# Patient Record
Sex: Female | Born: 1935 | Race: White | Hispanic: No | Smoking: Never smoker
Health system: Southern US, Community
[De-identification: ages and names within clinical notes are randomized; demographics above are authoritative.]

## PROBLEM LIST (undated history)

## (undated) DIAGNOSIS — E876 Hypokalemia: Secondary | ICD-10-CM

## (undated) DIAGNOSIS — Z8679 Personal history of other diseases of the circulatory system: Secondary | ICD-10-CM

## (undated) DIAGNOSIS — J45909 Unspecified asthma, uncomplicated: Secondary | ICD-10-CM

## (undated) DIAGNOSIS — R5381 Other malaise: Secondary | ICD-10-CM

## (undated) DIAGNOSIS — E871 Hypo-osmolality and hyponatremia: Secondary | ICD-10-CM

## (undated) DIAGNOSIS — K56609 Unspecified intestinal obstruction, unspecified as to partial versus complete obstruction: Secondary | ICD-10-CM

## (undated) DIAGNOSIS — K5790 Diverticulosis of intestine, part unspecified, without perforation or abscess without bleeding: Secondary | ICD-10-CM

## (undated) DIAGNOSIS — H9190 Unspecified hearing loss, unspecified ear: Secondary | ICD-10-CM

## (undated) DIAGNOSIS — M8580 Other specified disorders of bone density and structure, unspecified site: Secondary | ICD-10-CM

## (undated) DIAGNOSIS — E872 Acidosis, unspecified: Secondary | ICD-10-CM

## (undated) DIAGNOSIS — J189 Pneumonia, unspecified organism: Secondary | ICD-10-CM

## (undated) DIAGNOSIS — D62 Acute posthemorrhagic anemia: Secondary | ICD-10-CM

## (undated) DIAGNOSIS — D72829 Elevated white blood cell count, unspecified: Secondary | ICD-10-CM

## (undated) DIAGNOSIS — I509 Heart failure, unspecified: Secondary | ICD-10-CM

## (undated) DIAGNOSIS — J309 Allergic rhinitis, unspecified: Secondary | ICD-10-CM

## (undated) DIAGNOSIS — R002 Palpitations: Secondary | ICD-10-CM

## (undated) DIAGNOSIS — R55 Syncope and collapse: Secondary | ICD-10-CM

## (undated) DIAGNOSIS — N3020 Other chronic cystitis without hematuria: Secondary | ICD-10-CM

## (undated) DIAGNOSIS — E785 Hyperlipidemia, unspecified: Secondary | ICD-10-CM

## (undated) DIAGNOSIS — K219 Gastro-esophageal reflux disease without esophagitis: Secondary | ICD-10-CM

## (undated) DIAGNOSIS — H9193 Unspecified hearing loss, bilateral: Secondary | ICD-10-CM

## (undated) DIAGNOSIS — I2510 Atherosclerotic heart disease of native coronary artery without angina pectoris: Secondary | ICD-10-CM

## (undated) DIAGNOSIS — G43C0 Periodic headache syndromes in child or adult, not intractable: Secondary | ICD-10-CM

## (undated) DIAGNOSIS — M1990 Unspecified osteoarthritis, unspecified site: Secondary | ICD-10-CM

## (undated) DIAGNOSIS — I10 Essential (primary) hypertension: Secondary | ICD-10-CM

## (undated) HISTORY — DX: Palpitations: R00.2

## (undated) HISTORY — DX: Diverticulosis of intestine, part unspecified, without perforation or abscess without bleeding: K57.90

## (undated) HISTORY — DX: Hyperlipidemia, unspecified: E78.5

## (undated) HISTORY — DX: Hypokalemia: E87.6

## (undated) HISTORY — DX: Unspecified intestinal obstruction, unspecified as to partial versus complete obstruction: K56.609

## (undated) HISTORY — DX: Heart failure, unspecified: I50.9

## (undated) HISTORY — DX: Periodic headache syndromes in child or adult, not intractable: G43.C0

## (undated) HISTORY — DX: Hypomagnesemia: E83.42

## (undated) HISTORY — DX: Unspecified asthma, uncomplicated: J45.909

## (undated) HISTORY — DX: Hypo-osmolality and hyponatremia: E87.1

## (undated) HISTORY — DX: Allergic rhinitis, unspecified: J30.9

## (undated) HISTORY — DX: Acidosis: E87.2

## (undated) HISTORY — DX: Gastro-esophageal reflux disease without esophagitis: K21.9

## (undated) HISTORY — DX: Elevated white blood cell count, unspecified: D72.829

## (undated) HISTORY — DX: Acidosis, unspecified: E87.20

## (undated) HISTORY — DX: Other chronic cystitis without hematuria: N30.20

## (undated) HISTORY — DX: Atherosclerotic heart disease of native coronary artery without angina pectoris: I25.10

## (undated) HISTORY — DX: Acute posthemorrhagic anemia: D62

## (undated) HISTORY — DX: Other specified disorders of bone density and structure, unspecified site: M85.80

## (undated) HISTORY — DX: Unspecified hearing loss, bilateral: H91.93

## (undated) HISTORY — DX: Syncope and collapse: R55

## (undated) HISTORY — DX: Personal history of other diseases of the circulatory system: Z86.79

## (undated) HISTORY — DX: Pneumonia, unspecified organism: J18.9

## (undated) HISTORY — DX: Unspecified osteoarthritis, unspecified site: M19.90

## (undated) HISTORY — DX: Other malaise: R53.81

## (undated) HISTORY — DX: Essential (primary) hypertension: I10

## (undated) HISTORY — PX: EXPLORATORY LAPAROTOMY: SUR591

## (undated) HISTORY — DX: Unspecified hearing loss, unspecified ear: H91.90

---

## 2015-11-05 LAB — HEPATIC FUNCTION PANEL
ALK PHOS: 82 U/L (ref 25–125)
ALT: 14 U/L (ref 7–35)
AST: 26 U/L (ref 13–35)
BILIRUBIN, TOTAL: 0.5 mg/dL

## 2015-11-05 LAB — POCT INR: INR: 1 (ref 0.9–1.1)

## 2015-11-13 LAB — CBC AND DIFFERENTIAL
HCT: 33 % — AB (ref 36–46)
Hemoglobin: 11.3 g/dL — AB (ref 12.0–16.0)
PLATELETS: 205 10*3/uL (ref 150–399)
WBC: 13.3 10^3/mL

## 2015-11-15 LAB — BASIC METABOLIC PANEL
BUN: 11 mg/dL (ref 4–21)
CREATININE: 0.6 mg/dL (ref 0.5–1.1)
Glucose: 104 mg/dL
POTASSIUM: 3.7 mmol/L (ref 3.4–5.3)
SODIUM: 136 mmol/L — AB (ref 137–147)

## 2015-11-16 ENCOUNTER — Non-Acute Institutional Stay (SKILLED_NURSING_FACILITY): Payer: Medicare Other | Admitting: Adult Health

## 2015-11-16 ENCOUNTER — Encounter: Payer: Self-pay | Admitting: Adult Health

## 2015-11-16 DIAGNOSIS — J309 Allergic rhinitis, unspecified: Secondary | ICD-10-CM | POA: Diagnosis not present

## 2015-11-16 DIAGNOSIS — D62 Acute posthemorrhagic anemia: Secondary | ICD-10-CM | POA: Insufficient documentation

## 2015-11-16 DIAGNOSIS — K5669 Other intestinal obstruction: Secondary | ICD-10-CM

## 2015-11-16 DIAGNOSIS — K56609 Unspecified intestinal obstruction, unspecified as to partial versus complete obstruction: Secondary | ICD-10-CM | POA: Insufficient documentation

## 2015-11-16 DIAGNOSIS — E785 Hyperlipidemia, unspecified: Secondary | ICD-10-CM | POA: Insufficient documentation

## 2015-11-16 DIAGNOSIS — D72829 Elevated white blood cell count, unspecified: Secondary | ICD-10-CM | POA: Diagnosis not present

## 2015-11-16 DIAGNOSIS — I10 Essential (primary) hypertension: Secondary | ICD-10-CM | POA: Diagnosis not present

## 2015-11-16 DIAGNOSIS — R5381 Other malaise: Secondary | ICD-10-CM

## 2015-11-16 NOTE — Progress Notes (Signed)
Patient ID: Wendy Dickerson, female   DOB: 13-Sep-1924, 82 y.o.   MRN: [id number]    DATE:  11/16/2015   MRN:  [id number]  BIRTHDAY: Apr 07, 1925  Facility:  Nursing Home Location:  [facility]  Nursing Home Room Number: 601-P  LEVEL OF CARE:  SNF (31)  Contact Information    Name Relation Home Work Mobile   No name specified           Code Status History    This patient does not have a recorded code status. Please follow your organizational policy for patients in this situation.    Advance Directive Documentation        Most Recent Value   Type of Advance Directive  Out of facility DNR (pink MOST or yellow form)   Pre-existing out of facility DNR order (yellow form or pink MOST form)     "MOST" Form in Place?         Chief Complaint  Patient presents with  . Hospitalization Follow-up    HISTORY OF PRESENT ILLNESS:  This is an 82 year old female who has been admitted to [facility] on 11/15/15 from [facility]. She has PMH of hypertension, dyslipidemia, multiple environmental and medication allergies, previous history of abdominal surgeries and adhesions lysis in the past. She was diagnosed with small bowel obstruction and failed conservative management. She had exploratory laparotomy and lysis of adhesions on 11/07/15. They reduced her internal hernia, as well. She was treated for pneumonia with Levaquin 7 days.  Patient verbalized that she is allergic to and dairy products.  She has been admitted for a short-term rehabilitation.   PAST MEDICAL HISTORY:  Past Medical History  Diagnosis Date  . SBO (small bowel obstruction) (HCC)   . Pneumonia     Bilateral lower lobes  . Benign essential HTN   . Hyponatremia   . Vasovagal syncope   . Lactic acidosis   . Asthma   . Hard of hearing   . Osteopenia   . CHF (congestive heart failure) (HCC)   . Osteoarthritis   . GERD (gastroesophageal reflux disease)   . Hypokalemia   . Palpitations      PACs  . Hearing loss of both ears     Sensorineural   . History of rheumatic fever   . Hypomagnesemia   . Periodic headache syndrome   . HLD (hyperlipidemia)   . CAD (coronary artery disease)   . Chronic cystitis   . Diverticulosis      CURRENT MEDICATIONS: Reviewed  Patient's Medications  New Prescriptions   No medications on file  Previous Medications   ACETAMINOPHEN (TYLENOL) 325 MG TABLET    Take 650 mg by mouth every 4 (four) hours as needed.   ALBUTEROL (PROVENTIL HFA;VENTOLIN HFA) 108 (90 BASE) MCG/ACT INHALER    Inhale 1 puff into the lungs every 6 (six) hours as needed for wheezing or shortness of breath.   AMLODIPINE (NORVASC) 5 MG TABLET    Take 5 mg by mouth daily.   ASPIRIN 325 MG TABLET    Take 325 mg by mouth daily.   CALCIUM CARB-CHOLECALCIFEROL (CALTRATE 600+D) 600-800 MG-UNIT TABS    Take 2 tablets by mouth 2 (two) times daily.   CETIRIZINE (ZYRTEC) 10 MG TABLET    Take 10 mg by mouth daily.   FLUTICASONE (FLOVENT HFA) 220 MCG/ACT INHALER    Inhale 1 puff into the lungs 2 (two) times daily.   GLUCOSAMINE-CHONDROITIN 500-400 MG TABLET  Take 1 tablet by mouth 2 (two) times daily.   MAGNESIUM OXIDE (MAG-OX) 400 MG TABLET    Take 400 mg by mouth daily.   METOPROLOL SUCCINATE (TOPROL-XL) 50 MG 24 HR TABLET    Take 50 mg by mouth daily. Take with or immediately following a meal.   OMEGA-3 FATTY ACIDS (FISH OIL OMEGA-3 PO)    Take 1 g by mouth daily.   SIMVASTATIN (ZOCOR) 20 MG TABLET    Take 20 mg by mouth daily.   TRIAMTERENE-HYDROCHLOROTHIAZIDE (DYAZIDE) 37.5-25 MG CAPSULE    Take 1 capsule by mouth daily. Take at 6AM  Modified Medications   No medications on file  Discontinued Medications   No medications on file     Allergies  Allergen Reactions  . Ace Inhibitors   . Atrovent [Ipratropium]   . Augmentin [Amoxicillin-Pot Clavulanate]   . Beconase [Beclomethasone]   . Cartia Xt [Diltiazem Hcl]   . Celebrex [Celecoxib]   . Clarithromycin   . Corgard  [Nadolol]   . Doxycycline   . Intal [Cromolyn]   . Iodine   . Metoprolol   . Oxycontin [Oxycodone Hcl]   . Procardia [Nifedipine]   . Theophyllines      REVIEW OF SYSTEMS:  GENERAL: no change in appetite, no fatigue, no weight changes, no fever, chills or weakness EYES: Denies change in vision, dry eyes, eye pain, itching or discharge EARS: Denies change in hearing, ringing in ears, or earache NOSE: Denies nasal congestion or epistaxis MOUTH and THROAT: Denies oral discomfort, gingival pain or bleeding, pain from teeth or hoarseness   RESPIRATORY: no cough, SOB, DOE, wheezing, hemoptysis CARDIAC: no chest pain, or palpitations GI: no abdominal pain, diarrhea, constipation, heart burn, nausea or vomiting GU: Denies dysuria, frequency, hematuria, incontinence, or discharge PSYCHIATRIC: Denies feeling of depression or anxiety. No report of hallucinations, insomnia, paranoia, or agitation    PHYSICAL EXAMINATION  GENERAL APPEARANCE: Well nourished. In no acute distress. Normal body habitus SKIN:  Midline abdominal surgical incision has staples and covered with dry dressing, no erythema, has abdominal binder HEAD: Normal in size and contour. No evidence of trauma EYES: Lids open and close normally. No blepharitis, entropion or ectropion. PERRL. Conjunctivae are clear and sclerae are white. Lenses are without opacity EARS: Pinnae are normal. Patient hears normal voice tunes of the examiner MOUTH and THROAT: Lips are without lesions. Oral mucosa is moist and without lesions. Tongue is normal in shape, size, and color and without lesions NECK: supple, trachea midline, no neck masses, no thyroid tenderness, no thyromegaly LYMPHATICS: no LAN in the neck, no supraclavicular LAN RESPIRATORY: breathing is even & unlabored, BS CTAB CARDIAC: RRR, no murmur,no extra heart sounds, BLE edema 2+ GI: abdomen soft, normal BS, no masses, no tenderness, no hepatomegaly, no splenomegaly EXTREMITIES:   Able to move 4 extremities PSYCHIATRIC: Alert and oriented X 3. Affect and behavior are appropriate  LABS/RADIOLOGY: Labs reviewed: Basic Metabolic Panel:  Recent Labs  29/56/2106/26/17 0700  NA 136*  K 3.7  BUN 11  CREATININE 0.6   Liver Function Tests:  Recent Labs  11/05/15 0700  AST 26  ALT 14  ALKPHOS 82   CBC:  Recent Labs  11/13/15 0700  WBC 13.3  HGB 11.3*  HCT 33*  PLT 205      ASSESSMENT/PLAN:  Physical deconditioning - rehabilitation, OT and PT; follow-up precautions  SBO S/P exploratory laparotomy with lysis of adhesions - and reduction of internal hernia; follow-up with Dr. Bettey Mareavid Gimenez, General  Surgery, in 1 week; continue acetaminophen 325 mg take 2 tabs = 650 mg by mouth every 4 hours when necessary for pain  Hyperlipidemia - the Zocor 20 mg 1 tab by mouth daily.; check lipid panel  Hypertension - continue Dyazide 30 7. 5-25 milligrams 1 capsule by mouth daily, amlodipine besylate 5 mg 1 tab by mouth daily and metoprolol succinate ER 50 mg 1 tab by mouth daily; check BMP Q 72H X3  Allergic rhinitis - continue Zyrtec 10 mg 1 tab by mouth daily  Anemia, acute blood loss - check CBC Q 72 hours X3 Lab Results  Component Value Date   WBC 13.3 11/13/2015   HGB 11.3* 11/13/2015   HCT 33* 11/13/2015   PLT 205 11/13/2015   Leukocytosis -  will monitor, no cough, fever nor dysuria Lab Results  Component Value Date   WBC 13.3 11/13/2015      Goals of care:  Short-term rehabilitation     Kenard GowerMonina Medina-Vargas, NP [facility] [phone]

## 2015-11-17 LAB — BASIC METABOLIC PANEL
BUN: 11 mg/dL (ref 4–21)
Creatinine: 0.6 mg/dL (ref 0.5–1.1)
Glucose: 107 mg/dL
POTASSIUM: 3.9 mmol/L (ref 3.4–5.3)
SODIUM: 138 mmol/L (ref 137–147)

## 2015-11-17 LAB — CBC AND DIFFERENTIAL
HCT: 37 % (ref 36–46)
HEMOGLOBIN: 12.3 g/dL (ref 12.0–16.0)
Platelets: 498 10*3/uL — AB (ref 150–399)
WBC: 13.7 10*3/mL

## 2015-11-17 LAB — LIPID PANEL
CHOLESTEROL: 119 mg/dL (ref 0–200)
HDL: 28 mg/dL — AB (ref 35–70)
LDL Cholesterol: 73 mg/dL
TRIGLYCERIDES: 90 mg/dL (ref 40–160)

## 2015-11-18 ENCOUNTER — Encounter: Payer: Self-pay | Admitting: Internal Medicine

## 2015-11-18 ENCOUNTER — Non-Acute Institutional Stay (SKILLED_NURSING_FACILITY): Payer: Medicare Other | Admitting: Internal Medicine

## 2015-11-18 DIAGNOSIS — E785 Hyperlipidemia, unspecified: Secondary | ICD-10-CM | POA: Diagnosis not present

## 2015-11-18 DIAGNOSIS — Y95 Nosocomial condition: Secondary | ICD-10-CM

## 2015-11-18 DIAGNOSIS — I10 Essential (primary) hypertension: Secondary | ICD-10-CM

## 2015-11-18 DIAGNOSIS — J029 Acute pharyngitis, unspecified: Secondary | ICD-10-CM | POA: Diagnosis not present

## 2015-11-18 DIAGNOSIS — R531 Weakness: Secondary | ICD-10-CM

## 2015-11-18 DIAGNOSIS — D62 Acute posthemorrhagic anemia: Secondary | ICD-10-CM | POA: Diagnosis not present

## 2015-11-18 DIAGNOSIS — D72829 Elevated white blood cell count, unspecified: Secondary | ICD-10-CM | POA: Diagnosis not present

## 2015-11-18 DIAGNOSIS — J189 Pneumonia, unspecified organism: Secondary | ICD-10-CM | POA: Diagnosis not present

## 2015-11-18 DIAGNOSIS — K5669 Other intestinal obstruction: Secondary | ICD-10-CM

## 2015-11-18 DIAGNOSIS — R600 Localized edema: Secondary | ICD-10-CM | POA: Diagnosis not present

## 2015-11-18 DIAGNOSIS — K56609 Unspecified intestinal obstruction, unspecified as to partial versus complete obstruction: Secondary | ICD-10-CM

## 2015-11-18 NOTE — Progress Notes (Signed)
LOCATION: [facility]  PCP: No primary care provider on file.   Code Status: DNR  Goals of care: Advanced Directive information Advanced Directives 11/16/2015  Does patient have an advance directive? Yes  Type of Advance Directive Out of facility DNR (pink MOST or yellow form)  Does patient want to make changes to advanced directive? No - Patient declined  Copy of advanced directive(s) in chart? Yes       Extended Emergency Contact Information Primary Emergency Contact: Schmale,[name] Address: [address]          [place], [place] [address] [place] Mobile Phone: [phone] Relation: Daughter Secondary Emergency Contact: Bevis,Earl Address: [phone] [address]          [place], [place] [address] [place] Mobile Phone: [phone] Relation: Son   Allergies  Allergen Reactions  . Ace Inhibitors   . Atrovent [Ipratropium]   . Augmentin [Amoxicillin-Pot Clavulanate]   . Beconase [Beclomethasone]   . Cartia Xt [Diltiazem Hcl]   . Celebrex [Celecoxib]   . Clarithromycin   . Corgard [Nadolol]   . Doxycycline   . Intal [Cromolyn]   . Iodine   . Metoprolol   . Oxycontin [Oxycodone Hcl]   . Procardia [Nifedipine]   . Theophyllines     Chief Complaint  Patient presents with  . New Admit To SNF    New Admission     HPI:  Patient is a 82 y.o. female seen today for short term rehabilitation post hospital admission from 11/05/15-11/15/15 with small bowel obstruction. She underwent exploratory laprotomy and lysis of adhesions on 11/07/15. She underwent reduction of her hernia and was treated for pneumonia this admission. She is seen in her room today.  Review of Systems:  Constitutional: Negative for fever, chills, diaphoresis. Energy level slowly coming back.  HENT: Negative for congestion, difficulty swallowing. Positive for occasional headaches and sore throat post extubation.    Eyes: Negative for blurred vision. Wears  glasses. Respiratory: Negative for cough, shortness of breath and wheezing.   Cardiovascular: Negative for chest pain, palpitations, leg swelling.  Gastrointestinal: Negative for heartburn, nausea, vomiting, abdominal pain. Last bowel movement was today.  Genitourinary: Negative for dysuria and flank pain.  Musculoskeletal: Negative for back pain, fall in the facility.  Skin: Negative for itching, rash.  Neurological: Negative for dizziness. Psychiatric/Behavioral: Negative for depression.   Past Medical History  Diagnosis Date  . SBO (small bowel obstruction) (HCC)   . Pneumonia     Bilateral lower lobes  . Benign essential HTN   . Hyponatremia   . Vasovagal syncope   . Lactic acidosis   . Asthma   . Hard of hearing   . Osteopenia   . CHF (congestive heart failure) (HCC)   . Osteoarthritis   . GERD (gastroesophageal reflux disease)   . Hypokalemia   . Palpitations     PACs  . Hearing loss of both ears     Sensorineural   . History of rheumatic fever   . Hypomagnesemia   . Periodic headache syndrome   . HLD (hyperlipidemia)   . CAD (coronary artery disease)   . Chronic cystitis   . Diverticulosis    Past Surgical History  Procedure Laterality Date  . Exploratory laparotomy  [id number]    Lysis of adhesions   Social History:   reports that she has never smoked. She has never used smokeless tobacco. She reports that she does not drink alcohol or use illicit  drugs.  No family history on file.  Medications:   Medication List       This list is accurate as of: 11/18/15  2:22 PM.  Always use your most recent med list.               acetaminophen 325 MG tablet  Commonly known as:  TYLENOL  Take 650 mg by mouth every 4 (four) hours as needed.     albuterol 108 (90 Base) MCG/ACT inhaler  Commonly known as:  PROVENTIL HFA;VENTOLIN HFA  Inhale 1 puff into the lungs every 6 (six) hours as needed for wheezing or shortness of breath.     amLODipine 5 MG tablet   Commonly known as:  NORVASC  Take 5 mg by mouth daily.     aspirin 325 MG tablet  Take 325 mg by mouth daily.     CALTRATE 600+D 600-800 MG-UNIT Tabs  Generic drug:  Calcium Carb-Cholecalciferol  Take 2 tablets by mouth 2 (two) times daily.     cetirizine 10 MG tablet  Commonly known as:  ZYRTEC  Take 10 mg by mouth daily.     FISH OIL OMEGA-3 PO  Take 1 g by mouth daily.     fluticasone 220 MCG/ACT inhaler  Commonly known as:  FLOVENT HFA  Inhale 1 puff into the lungs 2 (two) times daily.     glucosamine-chondroitin 500-400 MG tablet  Take 1 tablet by mouth 2 (two) times daily.     magnesium oxide 400 MG tablet  Commonly known as:  MAG-OX  Take 400 mg by mouth daily.     simvastatin 20 MG tablet  Commonly known as:  ZOCOR  Take 20 mg by mouth daily.     triamterene-hydrochlorothiazide 37.5-25 MG capsule  Commonly known as:  DYAZIDE  Take 1 capsule by mouth daily. Take at 6AM        Immunizations: Immunization History  Administered Date(s) Administered  . PPD Test 11/15/2015     Physical Exam:  Filed Vitals:   11/18/15 1415  BP: 128/68  Pulse: 79  Temp: 97.3 F (36.3 C)  TempSrc: Oral  Resp: 16  Height: 5' (1.524 m)  Weight: 148 lb (67.132 kg)  SpO2: 94%   Body mass index is 28.9 kg/(m^2).  General- elderly female, overweight, in no acute distress Head- normocephalic, atraumatic Nose- no nasal discharge Throat- moist mucus membrane, dentures present, oropharyngeal erythema  Eyes- no pallor, no icterus, no discharge Neck- no cervical lymphadenopathy Cardiovascular- normal s1,s2, no murmur Respiratory- bilateral clear to auscultation, no wheeze, no rhonchi, no crackles, no use of accessory muscles Abdomen- bowel sounds present, soft, mild tenderness, abdominal binder in place Musculoskeletal- able to move all 4 extremities, generalized weakness, 2+ leg edema Neurological- alert and oriented to person, place and time Skin- warm and dry, midline  abdominal surgical incision with staples in place and has dressing with abdominal binder Psychiatry- normal mood and affect    Labs reviewed: Basic Metabolic Panel:  Recent Labs  40/98/1106/26/17 0700 11/17/15  NA 136* 138  K 3.7 3.9  BUN 11 11  CREATININE 0.6 0.6   Liver Function Tests:  Recent Labs  11/05/15 0700  AST 26  ALT 14  ALKPHOS 82   No results for input(s): LIPASE, AMYLASE in the last 8760 hours. No results for input(s): AMMONIA in the last 8760 hours. CBC:  Recent Labs  11/13/15 0700 11/17/15  WBC 13.3 13.7  HGB 11.3* 12.3  HCT 33* 37  PLT  205 498*    Radiological Exams: Ct Abdomen Pelvis Wo Contrast 11/05/15 Impression. Airspace infiltration seen in both lung bases probably representing bronchopneumonia. Distended small bowel with air fluid levels. Transition zone in the mid abdomen. Changes consistent with small bowel obstruction. Cholelithiasis.   Ct Head Wo Contrast 11/05/15 Impression. No acute intracranial abnormalities. Chronic atrophy and small vessel ischemic changes.  Xr Chest Pa and Lateral 11/05/15 Impression. Elevation of left hemidiaphragm with atelectasis in the left base. No focal consolidation.   Assessment/Plan  Generalized weakness Will have her work with physical therapy and occupational therapy team to help with gait training and muscle strengthening exercises.fall precautions. Skin care. Encourage to be out of bed.   Small bowel obstruction S/p ex lap with lysis of adhesions. Has follow up with surgery. Continue surgical incision skin care.continue tylenol prn pain. Monitor for signs of infection.   Sore throat Post extubation, start chlorseptic spray and monitor  Pneumonia Has completed her antibiotic treatment and breathing is stable, monitor  Blood loss anemia Post op, monitor cbc  Leukocytosis Monitor cbc with diff and provide wound care and monitor for signs of infection  HTN Monitor bp and bmp. Continue amlodipine  with dyazide. Off metoprolol with hx of bradycardia and pt not taking it at home.   Hyperlipidemia Stable, continue zocor 20 mg daily  Leg edema Add ted hose and monitor clinically. Consider lasix if required    Goals of care: short term rehabilitation   Labs/tests ordered: cbc with diff 11/22/15   Family/ staff Communication: reviewed care plan with patient and nursing supervisor    Oneal Grout, MD Internal Medicine [facility] [address] [place], [place] [address] Cell Phone (Monday-Friday 8 am - 5 pm): [phone] On Call: [phone] and follow prompts after 5 pm and on weekends Office Phone: [phone] Office Fax: [phone]

## 2015-11-19 LAB — CBC AND DIFFERENTIAL
HEMATOCRIT: 31 % — AB (ref 36–46)
HEMOGLOBIN: 10.5 g/dL — AB (ref 12.0–16.0)
NEUTROS ABS: 10 /uL
PLATELETS: 518 10*3/uL — AB (ref 150–399)
WBC: 13 10^3/mL

## 2015-11-19 LAB — BASIC METABOLIC PANEL
BUN: 10 mg/dL (ref 4–21)
CREATININE: 0.6 mg/dL (ref 0.5–1.1)
Glucose: 93 mg/dL
Potassium: 4.3 mmol/L (ref 3.4–5.3)
Sodium: 132 mmol/L — AB (ref 137–147)

## 2015-11-22 ENCOUNTER — Encounter: Payer: Self-pay | Admitting: Adult Health

## 2015-11-22 ENCOUNTER — Non-Acute Institutional Stay (SKILLED_NURSING_FACILITY): Payer: Medicare Other | Admitting: Adult Health

## 2015-11-22 DIAGNOSIS — T814XXA Infection following a procedure, initial encounter: Secondary | ICD-10-CM | POA: Diagnosis not present

## 2015-11-22 DIAGNOSIS — IMO0001 Reserved for inherently not codable concepts without codable children: Secondary | ICD-10-CM

## 2015-11-22 LAB — BASIC METABOLIC PANEL
BUN: 10 mg/dL (ref 4–21)
CREATININE: 0.5 mg/dL (ref 0.5–1.1)
GLUCOSE: 94 mg/dL
Potassium: 4 mmol/L (ref 3.4–5.3)
Sodium: 133 mmol/L — AB (ref 137–147)

## 2015-11-22 LAB — CBC AND DIFFERENTIAL
HCT: 34 % — AB (ref 36–46)
HEMOGLOBIN: 11.1 g/dL — AB (ref 12.0–16.0)
PLATELETS: 581 10*3/uL — AB (ref 150–399)
WBC: 13.8 10*3/mL

## 2015-11-22 NOTE — Progress Notes (Signed)
Patient ID: Wendy ShellerLois Jacobsen, female   DOB: 1924-06-25, 82 y.o.   MRN: [id number]    DATE:   11/22/15  MRN:  [id number]  BIRTHDAY: 1924-06-25  Facility:  Nursing Home Location:  [facility]  Nursing Home Room Number: 601-P  LEVEL OF CARE:  SNF (31)  Contact Information    Name Relation Home Work Mobile   Morioka,[name] Daughter   [phone]   Benn MoulderCarrick,Earl Son   [phone]   No name specified           Code Status History    This patient does not have a recorded code status. Please follow your organizational policy for patients in this situation.    Advance Directive Documentation        Most Recent Value   Type of Advance Directive  Out of facility DNR (pink MOST or yellow form)   Pre-existing out of facility DNR order (yellow form or pink MOST form)     "MOST" Form in Place?         Chief Complaint  Patient presents with  . Acute Visit    Abdominal surgical wound infection    HISTORY OF PRESENT ILLNESS:  This is an 82 year old female who was noted to have moderate brownish and yellowish  drainage on her abdominal incision, foul odod. Area is erythematous and staples are intact.  [facility]. She has PMH of hypertension, dyslipidemia, multiple environmental and medication allergies, previous history of abdominal surgeries and adhesions lysis in the past. She was diagnosed with small bowel obstruction and failed conservative management. She had exploratory laparotomy and lysis of adhesions on 11/07/15. They reduced her internal hernia, as well. She was treated for pneumonia with Levaquin 7 days.   PAST MEDICAL HISTORY:  Past Medical History  Diagnosis Date  . SBO (small bowel obstruction) (HCC)   . Pneumonia     Bilateral lower lobes  . Benign essential HTN   . Hyponatremia   . Vasovagal syncope   . Lactic acidosis   . Asthma   . Hard of hearing   . Osteopenia   . CHF  (congestive heart failure) (HCC)   . Osteoarthritis   . GERD (gastroesophageal reflux disease)   . Hypokalemia   . Palpitations     PACs  . Hearing loss of both ears     Sensorineural   . History of rheumatic fever   . Hypomagnesemia   . Periodic headache syndrome   . HLD (hyperlipidemia)   . CAD (coronary artery disease)   . Chronic cystitis   . Diverticulosis   . Physical deconditioning   . Allergic rhinitis   . Acute blood loss anemia   . Leukocytosis      CURRENT MEDICATIONS: Reviewed  Patient's Medications  New Prescriptions   No medications on file  Previous Medications   ACETAMINOPHEN (TYLENOL) 325 MG TABLET    Take 650 mg by mouth every 4 (four) hours as needed.   ALBUTEROL (PROVENTIL HFA;VENTOLIN HFA) 108 (90 BASE) MCG/ACT INHALER    Inhale 1 puff into the lungs every 6 (six) hours as needed for wheezing or shortness of breath.   AMLODIPINE (NORVASC) 5 MG TABLET    Take 5 mg by mouth daily.   ASPIRIN 325 MG TABLET    Take 325 mg by mouth daily.   CETIRIZINE (ZYRTEC) 10 MG TABLET    Take 10 mg by mouth daily.  FLUTICASONE (FLOVENT HFA) 220 MCG/ACT INHALER    Inhale 1 puff into the lungs 2 (two) times daily.   GLUCOSAMINE-CHONDROITIN 500-400 MG TABLET    Take 1 tablet by mouth 2 (two) times daily.   MAGNESIUM OXIDE (MAG-OX) 400 MG TABLET    Take 400 mg by mouth daily.   METOPROLOL SUCCINATE (TOPROL-XL) 50 MG 24 HR TABLET    Take 50 mg by mouth daily. Take with or immediately following a meal.   SIMVASTATIN (ZOCOR) 20 MG TABLET    Take 20 mg by mouth daily.  Modified Medications   No medications on file                          Allergies  Allergen Reactions  . Ace Inhibitors   . Atrovent [Ipratropium]   . Augmentin [Amoxicillin-Pot Clavulanate]   . Beconase [Beclomethasone]   . Cartia Xt [Diltiazem Hcl]   . Celebrex [Celecoxib]   . Clarithromycin   . Corgard [Nadolol]   . Doxycycline   . Intal [Cromolyn]   . Iodine   . Metoprolol   . Oxycontin  [Oxycodone Hcl]   . Procardia [Nifedipine]   . Theophyllines      REVIEW OF SYSTEMS:  GENERAL: no change in appetite, no fatigue, no weight changes, no fever, chills or weakness EYES: Denies change in vision, dry eyes, eye pain, itching or discharge EARS: Denies change in hearing, ringing in ears, or earache NOSE: Denies nasal congestion or epistaxis MOUTH and THROAT: Denies oral discomfort, gingival pain or bleeding, pain from teeth or hoarseness   RESPIRATORY: no cough, SOB, DOE, wheezing, hemoptysis CARDIAC: no chest pain, or palpitations GI: no abdominal pain, diarrhea, constipation, heart burn, nausea or vomiting GU: Denies dysuria, frequency, hematuria, incontinence, or discharge PSYCHIATRIC: Denies feeling of depression or anxiety. No report of hallucinations, insomnia, paranoia, or agitation    PHYSICAL EXAMINATION  GENERAL APPEARANCE: Well nourished. In no acute distress. Normal body habitus SKIN:  Midline abdominal surgical incision has intact staples, has moderate brownish and yellowish drainage with foul odor HEAD: Normal in size and contour. No evidence of trauma EYES: Lids open and close normally. No blepharitis, entropion or ectropion. PERRL. Conjunctivae are clear and sclerae are white. Lenses are without opacity EARS: Pinnae are normal. Patient hears normal voice tunes of the examiner MOUTH and THROAT: Lips are without lesions. Oral mucosa is moist and without lesions. Tongue is normal in shape, size, and color and without lesions NECK: supple, trachea midline, no neck masses, no thyroid tenderness, no thyromegaly LYMPHATICS: no LAN in the neck, no supraclavicular LAN RESPIRATORY: breathing is even & unlabored, BS CTAB CARDIAC: RRR, no murmur,no extra heart sounds, BLE edema 2+ GI: abdomen soft, normal BS, no masses, no tenderness, no hepatomegaly, no splenomegaly EXTREMITIES:  Able to move 4 extremities PSYCHIATRIC: Alert and oriented X 3. Affect and behavior are  appropriate  LABS/RADIOLOGY: Labs reviewed: Basic Metabolic Panel:  Recent Labs  16/10/96 11/25/15 11/26/15  NA 132* 136* 135*  K 4.3 3.6 3.5  BUN CREATININE 0.6 0.6 0.5   Liver Function Tests:  Recent Labs  11/05/15 0700 11/25/15  AST 26 13  ALT 14 6*  ALKPHOS 82 64   CBC:  Recent Labs  11/17/15 11/19/15 11/23/15  WBC 13.7 13.0 10.4  NEUTROABS  --  10  --   HGB 12.3 10.5* 11.4*  HCT 37 31* 34*  PLT 498* 518* 510*  ASSESSMENT/PLAN:  Abdominal surgical wound infection - start Cephalexin 500 mg PO Q 12 hours X 10 days and Florastor 250 mg 1 capsule PO BID X 13 days; do KUB, hold therapy until KUB is clear, inform Dr Dimas ChyleGimenez, general surgeon re: patient's condition and new orders     Kenard GowerMonina Medina-Vargas, NP [facility] [phone]

## 2015-11-23 LAB — CBC AND DIFFERENTIAL
HCT: 34 % — AB (ref 36–46)
Hemoglobin: 11.4 g/dL — AB (ref 12.0–16.0)
Platelets: 510 10*3/uL — AB (ref 150–399)
WBC: 10.4 10*3/mL

## 2015-11-23 LAB — POCT INR: INR: 1.2 — AB (ref 0.9–1.1)

## 2015-11-25 LAB — BASIC METABOLIC PANEL
BUN: 9 mg/dL (ref 4–21)
CREATININE: 0.6 mg/dL (ref 0.5–1.1)
GLUCOSE: 91 mg/dL
POTASSIUM: 3.6 mmol/L (ref 3.4–5.3)
SODIUM: 136 mmol/L — AB (ref 137–147)

## 2015-11-25 LAB — HEPATIC FUNCTION PANEL
ALT: 6 U/L — AB (ref 7–35)
AST: 13 U/L (ref 13–35)
Alkaline Phosphatase: 64 U/L (ref 25–125)
Bilirubin, Total: 0.2 mg/dL

## 2015-11-26 LAB — BASIC METABOLIC PANEL
BUN: 7 mg/dL (ref 4–21)
Creatinine: 0.5 mg/dL (ref 0.5–1.1)
POTASSIUM: 3.5 mmol/L (ref 3.4–5.3)
SODIUM: 135 mmol/L — AB (ref 137–147)

## 2015-11-29 ENCOUNTER — Encounter: Payer: Self-pay | Admitting: Adult Health

## 2015-11-29 ENCOUNTER — Non-Acute Institutional Stay (SKILLED_NURSING_FACILITY): Payer: Medicare Other | Admitting: Adult Health

## 2015-11-29 DIAGNOSIS — K5669 Other intestinal obstruction: Secondary | ICD-10-CM

## 2015-11-29 DIAGNOSIS — I10 Essential (primary) hypertension: Secondary | ICD-10-CM | POA: Diagnosis not present

## 2015-11-29 DIAGNOSIS — J309 Allergic rhinitis, unspecified: Secondary | ICD-10-CM

## 2015-11-29 DIAGNOSIS — E785 Hyperlipidemia, unspecified: Secondary | ICD-10-CM | POA: Diagnosis not present

## 2015-11-29 DIAGNOSIS — E43 Unspecified severe protein-calorie malnutrition: Secondary | ICD-10-CM

## 2015-11-29 DIAGNOSIS — R5381 Other malaise: Secondary | ICD-10-CM | POA: Diagnosis not present

## 2015-11-29 DIAGNOSIS — K56609 Unspecified intestinal obstruction, unspecified as to partial versus complete obstruction: Secondary | ICD-10-CM

## 2015-11-29 DIAGNOSIS — D62 Acute posthemorrhagic anemia: Secondary | ICD-10-CM

## 2015-11-29 NOTE — Progress Notes (Signed)
Patient ID: Wendy Dickerson, female   DOB: [birth year], 82 y.o.   MRN: [id number]    DATE:  11/29/2015   MRN:  [id number]  BIRTHDAY: [birth year]  Facility:  Nursing Home Location:  [facility]  Nursing Home Room Number: 601-P  LEVEL OF CARE:  SNF (31)  Contact Information    Name Relation Home Work Mobile   Bittinger,[name] Daughter   [phone]   Camielle, Sizer   [phone]   No name specified           Code Status History    This patient does not have a recorded code status. Please follow your organizational policy for patients in this situation.    Advance Directive Documentation        Most Recent Value   Type of Advance Directive  Out of facility DNR (pink MOST or yellow form)   Pre-existing out of facility DNR order (yellow form or pink MOST form)     "MOST" Form in Place?         Chief Complaint  Patient presents with  . Hospitalization Follow-up    HISTORY OF PRESENT ILLNESS:  This is an 82 year old female who has been re-admitted to [facility] on 11/26/15 from [facility]. She was transferred to the hospital on 11/22/15 and re-admitted back to [facility] on 11/26/15. She was admitted to the hospital for surgical wound infection and treated with IV antibiotics ( clindamycin, subsequently switched to IV Teflaro). She was, also, treated for hypercalcemia with IV fluids, and now resolved.  She has PMH of hypertension, dyslipidemia, multiple environmental and medication allergies, previous history of abdominal surgeries and adhesions lysis in the past. She was diagnosed with small bowel obstruction and failed conservative management. She had exploratory laparotomy and lysis of adhesions on 11/07/15. They reduced her internal hernia, as well. She was treated for pneumonia with Levaquin 7 days during that time.  She has been admitted for a short-term rehabilitation.   PAST MEDICAL HISTORY:  Past Medical History  Diagnosis Date  .  SBO (small bowel obstruction) (HCC)   . Pneumonia     Bilateral lower lobes  . Benign essential HTN   . Hyponatremia   . Vasovagal syncope   . Lactic acidosis   . Asthma   . Hard of hearing   . Osteopenia   . CHF (congestive heart failure) (HCC)   . Osteoarthritis   . GERD (gastroesophageal reflux disease)   . Hypokalemia   . Palpitations     PACs  . Hearing loss of both ears     Sensorineural   . History of rheumatic fever   . Hypomagnesemia   . Periodic headache syndrome   . HLD (hyperlipidemia)   . CAD (coronary artery disease)   . Chronic cystitis   . Diverticulosis   . Physical deconditioning   . Allergic rhinitis   . Acute blood loss anemia   . Leukocytosis      CURRENT MEDICATIONS: Reviewed  Patient's Medications  New Prescriptions   No medications on file  Previous Medications   ACETAMINOPHEN (TYLENOL) 325 MG TABLET    Take 650 mg by mouth every 4 (four) hours as needed.   ALBUTEROL (PROVENTIL HFA;VENTOLIN HFA) 108 (90 BASE) MCG/ACT INHALER    Inhale 1 puff into the lungs every 6 (six) hours as needed for wheezing or shortness of breath.   AMLODIPINE (NORVASC) 5 MG TABLET    Take 5 mg by mouth daily.  ASPIRIN 325 MG TABLET    Take 325 mg by mouth daily.   CETIRIZINE (ZYRTEC) 10 MG TABLET    Take 10 mg by mouth daily.   FLUTICASONE (FLOVENT HFA) 220 MCG/ACT INHALER    Inhale 1 puff into the lungs 2 (two) times daily.   GLUCOSAMINE-CHONDROITIN 500-400 MG TABLET    Take 1 tablet by mouth 2 (two) times daily.   MAGNESIUM OXIDE (MAG-OX) 400 MG TABLET    Take 400 mg by mouth daily.   METOPROLOL SUCCINATE (TOPROL-XL) 50 MG 24 HR TABLET    Take 50 mg by mouth daily. Take with or immediately following a meal.   SIMVASTATIN (ZOCOR) 20 MG TABLET    Take 20 mg by mouth daily.  Modified Medications   No medications on file  Discontinued Medications   CALCIUM CARB-CHOLECALCIFEROL (CALTRATE 600+D) 600-800 MG-UNIT TABS    Take 2 tablets by mouth 2 (two) times daily.    CEPHALEXIN (KEFLEX) 500 MG CAPSULE    Take 500 mg by mouth 2 (two) times daily.   OMEGA-3 FATTY ACIDS (FISH OIL OMEGA-3 PO)    Take 1 g by mouth daily.   SACCHAROMYCES BOULARDII (FLORASTOR) 250 MG CAPSULE    Take 250 mg by mouth 2 (two) times daily.   TRIAMTERENE-HYDROCHLOROTHIAZIDE (DYAZIDE) 37.5-25 MG CAPSULE    Take 1 capsule by mouth daily. Take at 6AM     Allergies  Allergen Reactions  . Ace Inhibitors   . Atrovent [Ipratropium]   . Augmentin [Amoxicillin-Pot Clavulanate]   . Beconase [Beclomethasone]   . Cartia Xt [Diltiazem Hcl]   . Celebrex [Celecoxib]   . Clarithromycin   . Corgard [Nadolol]   . Doxycycline   . Intal [Cromolyn]   . Iodine   . Metoprolol   . Oxycontin [Oxycodone Hcl]   . Procardia [Nifedipine]   . Theophyllines      REVIEW OF SYSTEMS:  GENERAL: no change in appetite, no fatigue, no weight changes, no fever, chills or weakness EYES: Denies change in vision, dry eyes, eye pain, itching or discharge EARS: Denies change in hearing, ringing in ears, or earache NOSE: Denies nasal congestion or epistaxis MOUTH and THROAT: Denies oral discomfort, gingival pain or bleeding, pain from teeth or hoarseness   RESPIRATORY: no cough, SOB, DOE, wheezing, hemoptysis CARDIAC: no chest pain, or palpitations GI: no abdominal pain, diarrhea, constipation, heart burn, nausea or vomiting GU: Denies dysuria, frequency, hematuria, incontinence, or discharge PSYCHIATRIC: Denies feeling of depression or anxiety. No report of hallucinations, insomnia, paranoia, or agitation    PHYSICAL EXAMINATION  GENERAL APPEARANCE: Well nourished. In no acute distress. Normal body habitus SKIN:  Midline abdominal surgical incision has staples and covered with dry dressing, no erythema, has abdominal binder HEAD: Normal in size and contour. No evidence of trauma EYES: Lids open and close normally. No blepharitis, entropion or ectropion. PERRL. Conjunctivae are clear and sclerae are  white. Lenses are without opacity EARS: Pinnae are normal. Patient hears normal voice tunes of the examiner MOUTH and THROAT: Lips are without lesions. Oral mucosa is moist and without lesions. Tongue is normal in shape, size, and color and without lesions NECK: supple, trachea midline, no neck masses, no thyroid tenderness, no thyromegaly LYMPHATICS: no LAN in the neck, no supraclavicular LAN RESPIRATORY: breathing is even & unlabored, BS CTAB CARDIAC: RRR, no murmur,no extra heart sounds, BLE edema 2+ GI: abdomen soft, normal BS, no masses, no tenderness, no hepatomegaly, no splenomegaly EXTREMITIES:  Able to move 4 extremities PSYCHIATRIC:  Alert and oriented X 3. Affect and behavior are appropriate  LABS/RADIOLOGY: Labs reviewed: Basic Metabolic Panel:  Recent Labs  86/57/8406/30/17 11/25/15 11/26/15  NA 132* 136* 135*  K 4.3 3.6 3.5  BUN 10 9 7   CREATININE 0.6 0.6 0.5   Liver Function Tests:  Recent Labs  11/05/15 0700 11/25/15  AST 26 13  ALT 14 6*  ALKPHOS 82 64   CBC:  Recent Labs  11/17/15 11/19/15 11/23/15  WBC 13.7 13.0 10.4  NEUTROABS  --  10  --   HGB 12.3 10.5* 11.4*  HCT 37 31* 34*  PLT 498* 518* 510*      ASSESSMENT/PLAN:  Physical deconditioning - rehabilitation, OT and PT; fall precautions  SBO S/P exploratory laparotomy with lysis of adhesions and reduction of internal hernia - given IV antibiotics in the hospital; follow-up with Dr. Bettey Mareavid Gimenez, General Surgery; continue acetaminophen 325 mg take 2 tabs = 650 mg by mouth every 4 hours when necessary for pain; Aspirin 325 mg 1 tab by mouth daily for DVT prophylaxis   Hyperlipidemia - the Zocor 20 mg 1 tab by mouth daily Lab Results  Component Value Date   CHOL 119 11/17/2015   HDL 28* 11/17/2015   LDLCALC 73 11/17/2015   TRIG 90 11/17/2015    Hypertension - continueNorvasc 5 mg 1 tab by mouth daily and metoprolol succinate ER 50 mg 1 tab by mouth daily; check BMP   Allergic rhinitis - continue  Zyrtec 10 mg 1 tab by mouth daily  Anemia, acute blood loss - check CBC  Lab Results  Component Value Date   WBC 10.4 11/23/2015   HGB 11.4* 11/23/2015   HCT 34* 11/23/2015   PLT 510* 11/23/2015   Protein calorie malnutrition, severe - albumin 2.5; RD consult; start Procel 2 scoops by mouth twice a day      Goals of care:  Short-term rehabilitation     Kenard GowerMonina Medina-Vargas, NP [facility] [phone]

## 2015-11-30 ENCOUNTER — Encounter: Payer: Self-pay | Admitting: Internal Medicine

## 2015-11-30 ENCOUNTER — Non-Acute Institutional Stay (SKILLED_NURSING_FACILITY): Payer: Medicare Other | Admitting: Internal Medicine

## 2015-11-30 DIAGNOSIS — K469 Unspecified abdominal hernia without obstruction or gangrene: Secondary | ICD-10-CM | POA: Diagnosis not present

## 2015-11-30 DIAGNOSIS — T814XXS Infection following a procedure, sequela: Secondary | ICD-10-CM

## 2015-11-30 DIAGNOSIS — Z8719 Personal history of other diseases of the digestive system: Secondary | ICD-10-CM | POA: Diagnosis not present

## 2015-11-30 DIAGNOSIS — R600 Localized edema: Secondary | ICD-10-CM | POA: Diagnosis not present

## 2015-11-30 DIAGNOSIS — I10 Essential (primary) hypertension: Secondary | ICD-10-CM

## 2015-11-30 DIAGNOSIS — D62 Acute posthemorrhagic anemia: Secondary | ICD-10-CM | POA: Diagnosis not present

## 2015-11-30 DIAGNOSIS — E785 Hyperlipidemia, unspecified: Secondary | ICD-10-CM

## 2015-11-30 DIAGNOSIS — R5381 Other malaise: Secondary | ICD-10-CM | POA: Diagnosis not present

## 2015-11-30 DIAGNOSIS — IMO0001 Reserved for inherently not codable concepts without codable children: Secondary | ICD-10-CM

## 2015-11-30 DIAGNOSIS — K458 Other specified abdominal hernia without obstruction or gangrene: Secondary | ICD-10-CM

## 2015-11-30 LAB — BASIC METABOLIC PANEL
BUN: 6 mg/dL (ref 4–21)
CREATININE: 0.6 mg/dL (ref 0.5–1.1)
GLUCOSE: 87 mg/dL
Potassium: 4.1 mmol/L (ref 3.4–5.3)
SODIUM: 137 mmol/L (ref 137–147)

## 2015-11-30 LAB — CBC AND DIFFERENTIAL
HEMATOCRIT: 34 % — AB (ref 36–46)
Hemoglobin: 10.9 g/dL — AB (ref 12.0–16.0)
Neutrophils Absolute: 5 /uL
Platelets: 249 10*3/uL (ref 150–399)
WBC: 8.1 10^3/mL

## 2015-11-30 NOTE — Progress Notes (Signed)
LOCATION: [facility]  PCP: No primary care provider on file.   Code Status: DNR  Goals of care: Advanced Directive information Advanced Directives 11/29/2015  Does patient have an advance directive? Yes  Type of Advance Directive Out of facility DNR (pink MOST or yellow form)  Does patient want to make changes to advanced directive? No - Patient declined  Copy of advanced directive(s) in chart? Yes       Extended Emergency Contact Information Primary Emergency Contact: Leite,[name] Address: [address]          [place], [place] [address] [place] Mobile Phone: [phone] Relation: Daughter Secondary Emergency Contact: Branscome,Earl Address: [phone] [address]          [place], [place] [address] [place] Mobile Phone: [phone] Relation: Son   Allergies  Allergen Reactions  . Ace Inhibitors   . Atrovent [Ipratropium]   . Augmentin [Amoxicillin-Pot Clavulanate]   . Beconase [Beclomethasone]   . Cartia Xt [Diltiazem Hcl]   . Celebrex [Celecoxib]   . Clarithromycin   . Corgard [Nadolol]   . Doxycycline   . Intal [Cromolyn]   . Iodine   . Metoprolol   . Oxycontin [Oxycodone Hcl]   . Procardia [Nifedipine]   . Theophyllines     Chief Complaint  Patient presents with  . Readmit To SNF    Readmission     HPI:  Patient is a 82 y.o. female seen today for short term rehabilitation post hospital re-admission from 11/22/15-11/26/15 with infection to her surgical site. She was undergoing rehabilitation in this facility post surgical repair of her hernia prior to this hospital admission. She was treated with iv antibiotics and was seen by general surgery and wound care team. She had hypercalcemia and required iv fluids. She is seen in her room today.   Review of Systems:  Constitutional: Negative for fever, chills, diaphoresis. Energy level is slowly coming back.  HENT: Negative for headache, congestion, nasal discharge. Eyes:  Negative for blurred vision, double vision and discharge. Wears glasses.  Respiratory: Negative for cough and wheezing. Positive for shortness of breath with exertion.  Cardiovascular: Negative for chest pain, palpitations. Positive for leg swelling.  Gastrointestinal: Negative for heartburn, nausea, vomiting, abdominal pain. Last bowel movement was yesterday. Genitourinary: Negative for dysuria and flank pain.  Musculoskeletal: Negative for back pain, fall in the facility.  Skin: Negative for itching, rash.  Neurological: Negative for dizziness. Psychiatric/Behavioral: Negative for depression   Past Medical History  Diagnosis Date  . SBO (small bowel obstruction) (HCC)   . Pneumonia     Bilateral lower lobes  . Benign essential HTN   . Hyponatremia   . Vasovagal syncope   . Lactic acidosis   . Asthma   . Hard of hearing   . Osteopenia   . CHF (congestive heart failure) (HCC)   . Osteoarthritis   . GERD (gastroesophageal reflux disease)   . Hypokalemia   . Palpitations     PACs  . Hearing loss of both ears     Sensorineural   . History of rheumatic fever   . Hypomagnesemia   . Periodic headache syndrome   . HLD (hyperlipidemia)   . CAD (coronary artery disease)   . Chronic cystitis   . Diverticulosis   . Physical deconditioning   . Allergic rhinitis   . Acute blood loss anemia   . Leukocytosis    Past Surgical History  Procedure Laterality Date  .  Exploratory laparotomy  [id number]    Lysis of adhesions   Social History:   reports that she has never smoked. She has never used smokeless tobacco. She reports that she does not drink alcohol or use illicit drugs.  No family history on file.  Medications:   Medication List       This list is accurate as of: 11/30/15 12:11 PM.  Always use your most recent med list.               acetaminophen 325 MG tablet  Commonly known as:  TYLENOL  Take 650 mg by mouth every 4 (four) hours as needed.     albuterol 108 (90  Base) MCG/ACT inhaler  Commonly known as:  PROVENTIL HFA;VENTOLIN HFA  Inhale 1 puff into the lungs every 6 (six) hours as needed for wheezing or shortness of breath.     amLODipine 5 MG tablet  Commonly known as:  NORVASC  Take 5 mg by mouth daily.     aspirin 325 MG tablet  Take 325 mg by mouth daily.     cetirizine 10 MG tablet  Commonly known as:  ZYRTEC  Take 10 mg by mouth daily.     fluticasone 220 MCG/ACT inhaler  Commonly known as:  FLOVENT HFA  Inhale 1 puff into the lungs 2 (two) times daily.     glucosamine-chondroitin 500-400 MG tablet  Take 1 tablet by mouth 2 (two) times daily.     magnesium oxide 400 MG tablet  Commonly known as:  MAG-OX  Take 400 mg by mouth daily.     metoprolol succinate 50 MG 24 hr tablet  Commonly known as:  TOPROL-XL  Take 50 mg by mouth daily. Take with or immediately following a meal.     PROCEL Powd  Take 2 scoop by mouth 2 (two) times daily.     simvastatin 20 MG tablet  Commonly known as:  ZOCOR  Take 20 mg by mouth daily.        Immunizations: Immunization History  Administered Date(s) Administered  . PPD Test 11/15/2015     Physical Exam: Filed Vitals:   11/30/15 1204  BP: 134/90  Pulse: 72  Temp: 97.5 F (36.4 C)  TempSrc: Oral  Resp: 16  Height:  (1.499 m)  Weight: 133 lb 12.8 oz (60.691 kg)  SpO2: 97%   Body mass index is 27.01 kg/(m^2).  General- elderly female, well built, in no acute distress Head- normocephalic, atraumatic Nose- no nasal discharge Throat- moist mucus membrane, has dentures Eyes- PERRLA, EOMI, no pallor, no icterus, no discharge, normal conjunctiva, normal sclera Neck- no cervical lymphadenopathy Cardiovascular- normal s1,s2, no murmur, has 1+ leg edema Respiratory- bilateral clear to auscultation, no wheeze, no rhonchi, no crackles, no use of accessory muscles Abdomen- bowel sounds present, soft, non tender Musculoskeletal- able to move all 4 extremities, generalized  weakness, on wheelchair Neurological- alert and oriented to person, place and time Skin- warm and dry, surgical incision to abdominal wall with staples in place and mild erythema, dressing clean and dry Psychiatry- normal mood and affect    Labs reviewed: Basic Metabolic Panel:  Recent Labs  16/10/96 11/25/15 11/26/15  NA 133* 136* 135*  K 4.0 3.6 3.5  BUN CREATININE 0.5 0.6 0.5   Liver Function Tests:  Recent Labs  11/05/15 0700 11/25/15  AST 26 13  ALT 14 6*  ALKPHOS 82 64   No results for input(s): LIPASE, AMYLASE in  the last 8760 hours. No results for input(s): AMMONIA in the last 8760 hours. CBC:  Recent Labs  11/19/15 11/22/15 11/23/15  WBC 13.0 13.8 10.4  NEUTROABS 10  --   --   HGB 10.5* 11.1* 11.4*  HCT 31* 34* 34*  PLT 518* 581* 510*    Assessment/Plan  Physical deconditioning Will have him work with physical therapy and occupational therapy team to help with gait training and muscle strengthening exercises.fall precautions. Skin care. Encourage to be out of bed. Continue nutritional supplement  Surgical site infection Has completed her antibiotics course and has made clinical improvement. Monitor. Continue wound care.   Hypercalcemia S/p iv fluids, monitor labs  Small bowel obstruction S/P exploratory laparotomy with lysis of adhesions and reduction of internal hernia. Continue tylenol as needed for pain. Has f/u with general surgery  Abdominal hernia S/p hernia repair. Continue to wear abdominal binder. Will have patient work with PT/OT as tolerated to regain strength and restore function.  Fall precautions are in place.  Blood loss Anemia Monitor cbc  Leg edema Add ted hose to both legs. Keep legs elevated at rest  HTN Monitor bp, continue toprol xl and amlodipine. Check bmp. Continue aspirin  Hyperlipidemia  Continue Zocor    Goals of care: short term rehabilitation   Labs/tests ordered: cbc, bmp  Family/ staff  Communication: reviewed care plan with patient and nursing supervisor    Oneal Grout, MD Internal Medicine [facility] [address] [place], [place] [address] Cell Phone (Monday-Friday 8 am - 5 pm): [phone] On Call: [phone] and follow prompts after 5 pm and on weekends Office Phone: [phone] Office Fax: [phone]

## 2015-12-01 ENCOUNTER — Non-Acute Institutional Stay (SKILLED_NURSING_FACILITY): Payer: Medicare Other | Admitting: Adult Health

## 2015-12-01 ENCOUNTER — Encounter: Payer: Self-pay | Admitting: Adult Health

## 2015-12-01 DIAGNOSIS — IMO0001 Reserved for inherently not codable concepts without codable children: Secondary | ICD-10-CM

## 2015-12-01 DIAGNOSIS — T814XXS Infection following a procedure, sequela: Secondary | ICD-10-CM | POA: Diagnosis not present

## 2015-12-01 NOTE — Progress Notes (Signed)
Patient ID: Wendy Dickerson, female   DOB: [birth year], 82 y.o.   MRN: [id number]    DATE:  12/01/2015   MRN:  [id number]  BIRTHDAY: 1925/03/05  Facility:  Nursing Home Location:  [facility]  Nursing Home Room Number: 601-P  LEVEL OF CARE:  SNF (31)  Contact Information    Name Relation Home Work Mobile   Jans,[name] Daughter   [phone]   Shanikia, [employer]   [phone]   No name specified           Code Status History    This patient does not have a recorded code status. Please follow your organizational policy for patients in this situation.    Advance Directive Documentation        Most Recent Value   Type of Advance Directive  Out of facility DNR (pink MOST or yellow form)   Pre-existing out of facility DNR order (yellow form or pink MOST form)     "MOST" Form in Place?         Chief Complaint  Patient presents with  . Acute Visit    Infection abdominal surgical incision    HISTORY OF PRESENT ILLNESS:  Wound nurse called me to the patient's room with concerns for moderate abdominal surgical incision drainage. Drainage is brownish/yellowish. Area is erythematous. Staples intact  She been re-admitted to [facility] on 11/26/15 from [facility]. She was transferred to the hospital on 11/22/15 and re-admitted back to [facility] on 11/26/15. She was admitted to the hospital for surgical wound infection and treated with IV antibiotics ( clindamycin, subsequently switched to IV Teflaro). She was, also, treated for hypercalcemia with IV fluids, and now resolved.  She has PMH of hypertension, dyslipidemia, multiple environmental and medication allergies, previous history of abdominal surgeries and adhesions lysis in the past. She was diagnosed with small bowel obstruction and failed conservative management. She had exploratory laparotomy and lysis of adhesions on 11/07/15. They reduced her internal hernia, as well. She was treated for  pneumonia with Levaquin 7 days during that time.  She has been admitted for a short-term rehabilitation.   PAST MEDICAL HISTORY:  Past Medical History  Diagnosis Date  . SBO (small bowel obstruction) (HCC)   . Pneumonia     Bilateral lower lobes  . Benign essential HTN   . Hyponatremia   . Vasovagal syncope   . Lactic acidosis   . Asthma   . Hard of hearing   . Osteopenia   . CHF (congestive heart failure) (HCC)   . Osteoarthritis   . GERD (gastroesophageal reflux disease)   . Hypokalemia   . Palpitations     PACs  . Hearing loss of both ears     Sensorineural   . History of rheumatic fever   . Hypomagnesemia   . Periodic headache syndrome   . HLD (hyperlipidemia)   . CAD (coronary artery disease)   . Chronic cystitis   . Diverticulosis   . Physical deconditioning   . Allergic rhinitis   . Acute blood loss anemia   . Leukocytosis      CURRENT MEDICATIONS: Reviewed  Patient's Medications  New Prescriptions   No medications on file  Previous Medications   ACETAMINOPHEN (TYLENOL) 325 MG TABLET    Take 650 mg by mouth every 4 (four) hours as needed.   ALBUTEROL (PROVENTIL HFA;VENTOLIN HFA) 108 (90 BASE) MCG/ACT INHALER    Inhale 1 puff into the lungs every 6 (six)  hours as needed for wheezing or shortness of breath.   AMLODIPINE (NORVASC) 5 MG TABLET    Take 5 mg by mouth daily.   ASPIRIN 325 MG TABLET    Take 325 mg by mouth daily.   CETIRIZINE (ZYRTEC) 10 MG TABLET    Take 10 mg by mouth daily.   CLINDAMYCIN (CLEOCIN) 300 MG CAPSULE    Take 300 mg by mouth 4 (four) times daily. Take for 10 days   FLUTICASONE (FLOVENT HFA) 220 MCG/ACT INHALER    Inhale 1 puff into the lungs 2 (two) times daily.   GLUCOSAMINE-CHONDROITIN 500-400 MG TABLET    Take 1 tablet by mouth 2 (two) times daily.   MAGNESIUM OXIDE (MAG-OX) 400 MG TABLET    Take 400 mg by mouth daily.   METOPROLOL SUCCINATE (TOPROL-XL) 50 MG 24 HR TABLET    Take 50 mg by mouth daily. Take with or immediately  following a meal.   PROTEIN (PROCEL) POWD    Take 2 scoop by mouth 2 (two) times daily.   SACCHAROMYCES BOULARDII (FLORASTOR) 250 MG CAPSULE    Take 250 mg by mouth 2 (two) times daily. Take for 13 days   SIMVASTATIN (ZOCOR) 20 MG TABLET    Take 20 mg by mouth daily.  Modified Medications   No medications on file  Discontinued Medications   No medications on file     Allergies  Allergen Reactions  . Ace Inhibitors   . Atrovent [Ipratropium]   . Augmentin [Amoxicillin-Pot Clavulanate]   . Beconase [Beclomethasone]   . Cartia Xt [Diltiazem Hcl]   . Celebrex [Celecoxib]   . Clarithromycin   . Corgard [Nadolol]   . Doxycycline   . Intal [Cromolyn]   . Iodine   . Metoprolol   . Oxycontin [Oxycodone Hcl]   . Procardia [Nifedipine]   . Theophyllines      REVIEW OF SYSTEMS:  GENERAL: no change in appetite, no fatigue, no weight changes, no fever, chills or weakness EYES: Denies change in vision, dry eyes, eye pain, itching or discharge EARS: Denies change in hearing, ringing in ears, or earache NOSE: Denies nasal congestion or epistaxis MOUTH and THROAT: Denies oral discomfort, gingival pain or bleeding, pain from teeth or hoarseness   RESPIRATORY: no cough, SOB, DOE, wheezing, hemoptysis CARDIAC: no chest pain, or palpitations GI: no abdominal pain, diarrhea, constipation, heart burn, nausea or vomiting GU: Denies dysuria, frequency, hematuria, incontinence, or discharge PSYCHIATRIC: Denies feeling of depression or anxiety. No report of hallucinations, insomnia, paranoia, or agitation    PHYSICAL EXAMINATION  GENERAL APPEARANCE: Well nourished. In no acute distress. Normal body habitus SKIN:  Midline abdominal surgical incision has staples, has moderate brownish/yellowish discharge, area is erythematous HEAD: Normal in size and contour. No evidence of trauma EYES: Lids open and close normally. No blepharitis, entropion or ectropion. PERRL. Conjunctivae are clear and  sclerae are white. Lenses are without opacity EARS: Pinnae are normal. Patient hears normal voice tunes of the examiner MOUTH and THROAT: Lips are without lesions. Oral mucosa is moist and without lesions. Tongue is normal in shape, size, and color and without lesions NECK: supple, trachea midline, no neck masses, no thyroid tenderness, no thyromegaly LYMPHATICS: no LAN in the neck, no supraclavicular LAN RESPIRATORY: breathing is even & unlabored, BS CTAB CARDIAC: RRR, no murmur,no extra heart sounds, BLE edema 2+ GI: abdomen soft, normal BS, no masses, no tenderness, no hepatomegaly, no splenomegaly EXTREMITIES:  Able to move 4 extremities PSYCHIATRIC: Alert and oriented  X 3. Affect and behavior are appropriate  LABS/RADIOLOGY: Labs reviewed: Basic Metabolic Panel:  Recent Labs  82/95/6205/11/05 11/26/15 11/30/15  NA 136* 135* 137  K 3.6 3.5 4.1  BUN 9 7 6   CREATININE 0.6 0.5 0.6   Liver Function Tests:  Recent Labs  11/05/15 0700 11/25/15  AST 26 13  ALT 14 6*  ALKPHOS 82 64   CBC:  Recent Labs  11/19/15 11/22/15 11/23/15 11/30/15  WBC 13.0 13.8 10.4 8.1  NEUTROABS 10  --   --  5  HGB 10.5* 11.1* 11.4* 10.9*  HCT 31* 34* 34* 34*  PLT 518* 581* 510* 249      ASSESSMENT/PLAN:  Surgical wound infection - start Clindamycin 300 mg 1 capsule PO QID X 10 days; start Florastor 250 mg 1 capsule PO BID X 13 days; notify Dr. Dimas ChyleGimenez, general surgeon, regarding moderate wound drainage    Kenard GowerMonina Medina-Vargas, NP [facility] [phone]

## 2015-12-09 ENCOUNTER — Non-Acute Institutional Stay (SKILLED_NURSING_FACILITY): Payer: Medicare Other | Admitting: Adult Health

## 2015-12-09 ENCOUNTER — Encounter: Payer: Self-pay | Admitting: Adult Health

## 2015-12-09 DIAGNOSIS — K56609 Unspecified intestinal obstruction, unspecified as to partial versus complete obstruction: Secondary | ICD-10-CM

## 2015-12-09 DIAGNOSIS — D62 Acute posthemorrhagic anemia: Secondary | ICD-10-CM | POA: Diagnosis not present

## 2015-12-09 DIAGNOSIS — T814XXS Infection following a procedure, sequela: Secondary | ICD-10-CM | POA: Diagnosis not present

## 2015-12-09 DIAGNOSIS — K5669 Other intestinal obstruction: Secondary | ICD-10-CM

## 2015-12-09 DIAGNOSIS — J309 Allergic rhinitis, unspecified: Secondary | ICD-10-CM

## 2015-12-09 DIAGNOSIS — IMO0001 Reserved for inherently not codable concepts without codable children: Secondary | ICD-10-CM

## 2015-12-09 DIAGNOSIS — E785 Hyperlipidemia, unspecified: Secondary | ICD-10-CM | POA: Diagnosis not present

## 2015-12-09 DIAGNOSIS — E43 Unspecified severe protein-calorie malnutrition: Secondary | ICD-10-CM

## 2015-12-09 DIAGNOSIS — I10 Essential (primary) hypertension: Secondary | ICD-10-CM

## 2015-12-09 DIAGNOSIS — R5381 Other malaise: Secondary | ICD-10-CM

## 2015-12-09 NOTE — Progress Notes (Signed)
Patient ID: Wendy Dickerson, female   DOB: Jan 20, 1925, 82 y.o.   MRN: [id number]    DATE:  12/09/2015   MRN:  [id number]  BIRTHDAY: June 09, 1924  Facility:  Nursing Home Location:  [facility]  Nursing Home Room Number: 601-P  LEVEL OF CARE:  SNF (31)  Contact Information    Name Relation Home Work Mobile   Wendy Dickerson,Wendy Dickerson Daughter   [phone]   Wendy Dickerson, Wendy Dickerson   [phone]   No name specified           Code Status History    This patient does not have a recorded code status. Please follow your organizational policy for patients in this situation.    Advance Directive Documentation        Most Recent Value   Type of Advance Directive  Out of facility DNR (pink MOST or yellow form)   Pre-existing out of facility DNR order (yellow form or pink MOST form)     "MOST" Form in Place?         Chief Complaint  Patient presents with  . Discharge Note    HISTORY OF PRESENT ILLNESS:  This is an 82 year old female who is for discharge home with medications and Home health CNA For showers, OT for ADLs, PT for endurance and skilled nurse for wound care.  She who has been re-admitted to [facility] on 11/26/15 from [facility]. She was transferred to the hospital on 11/22/15 and re-admitted back to [facility] on 11/26/15. She was admitted to the hospital for surgical wound infection and treated with IV antibiotics ( clindamycin, subsequently switched to IV Teflaro). She was, also, treated for hypercalcemia with IV fluids, and now resolved.  She has PMH of hypertension, dyslipidemia, multiple environmental and medication allergies, previous history of abdominal surgeries and adhesions lysis in the past. She was diagnosed with small bowel obstruction and failed conservative management. She had exploratory laparotomy and lysis of adhesions on 11/07/15. They reduced her internal hernia, as well. She was treated for pneumonia with Levaquin 7 days during that  time.  Patient was admitted to this facility for short-term rehabilitation after the patient's recent hospitalization.  Patient has completed SNF rehabilitation and therapy has cleared the patient for discharge.   PAST MEDICAL HISTORY:  Past Medical History  Diagnosis Date  . SBO (small bowel obstruction) (HCC)   . Pneumonia     Bilateral lower lobes  . Benign essential HTN   . Hyponatremia   . Vasovagal syncope   . Lactic acidosis   . Asthma   . Hard of hearing   . Osteopenia   . CHF (congestive heart failure) (HCC)   . Osteoarthritis   . GERD (gastroesophageal reflux disease)   . Hypokalemia   . Palpitations     PACs  . Hearing loss of both ears     Sensorineural   . History of rheumatic fever   . Hypomagnesemia   . Periodic headache syndrome   . HLD (hyperlipidemia)   . CAD (coronary artery disease)   . Chronic cystitis   . Diverticulosis   . Physical deconditioning   . Allergic rhinitis   . Acute blood loss anemia   . Leukocytosis      CURRENT MEDICATIONS: Reviewed  Patient's Medications  New Prescriptions   No medications on file  Previous Medications   ACETAMINOPHEN (TYLENOL) 325 MG TABLET    Take 650 mg by mouth every 4 (four) hours as needed.  ALBUTEROL (PROVENTIL HFA;VENTOLIN HFA) 108 (90 BASE) MCG/ACT INHALER    Inhale 1 puff into the lungs every 6 (six) hours as needed for wheezing or shortness of breath.   AMLODIPINE (NORVASC) 5 MG TABLET    Take 5 mg by mouth daily.   ASPIRIN 325 MG TABLET    Take 325 mg by mouth daily.   CETIRIZINE (ZYRTEC) 10 MG TABLET    Take 10 mg by mouth daily.   CLINDAMYCIN (CLEOCIN) 300 MG CAPSULE    Take 300 mg by mouth 4 (four) times daily. Take for 10 days   FLUTICASONE (FLOVENT HFA) 220 MCG/ACT INHALER    Inhale 1 puff into the lungs 2 (two) times daily.   GLUCOSAMINE-CHONDROITIN 500-400 MG TABLET    Take 1 tablet by mouth 2 (two) times daily.   MAGNESIUM OXIDE (MAG-OX) 400 MG TABLET    Take 400 mg by mouth 2 (two) times  daily.    METOPROLOL SUCCINATE (TOPROL-XL) 50 MG 24 HR TABLET    Take 50 mg by mouth daily. Take with or immediately following a meal.   PROTEIN (PROCEL) POWD    Take 2 scoop by mouth 3 (three) times daily.    SACCHAROMYCES BOULARDII (FLORASTOR) 250 MG CAPSULE    Take 250 mg by mouth 2 (two) times daily. Take for 13 days   SIMVASTATIN (ZOCOR) 20 MG TABLET    Take 20 mg by mouth daily.  Modified Medications   No medications on file  Discontinued Medications   No medications on file     Allergies  Allergen Reactions  . Ace Inhibitors   . Atrovent [Ipratropium]   . Augmentin [Amoxicillin-Pot Clavulanate]   . Beconase [Beclomethasone]   . Cartia Xt [Diltiazem Hcl]   . Celebrex [Celecoxib]   . Clarithromycin   . Corgard [Nadolol]   . Doxycycline   . Intal [Cromolyn]   . Iodine   . Metoprolol   . Oxycontin [Oxycodone Hcl]   . Procardia [Nifedipine]   . Theophyllines      REVIEW OF SYSTEMS:  GENERAL: no change in appetite, no fatigue, no weight changes, no fever, chills or weakness EYES: Denies change in vision, dry eyes, eye pain, itching or discharge EARS: Denies change in hearing, ringing in ears, or earache NOSE: Denies nasal congestion or epistaxis MOUTH and THROAT: Denies oral discomfort, gingival pain or bleeding, pain from teeth or hoarseness   RESPIRATORY: no cough, SOB, DOE, wheezing, hemoptysis CARDIAC: no chest pain, or palpitations GI: no abdominal pain, diarrhea, constipation, heart burn, nausea or vomiting GU: Denies dysuria, frequency, hematuria, incontinence, or discharge PSYCHIATRIC: Denies feeling of depression or anxiety. No report of hallucinations, insomnia, paranoia, or agitation    PHYSICAL EXAMINATION  GENERAL APPEARANCE: Well nourished. In no acute distress. Normal body habitus SKIN:  Midline abdominal surgical incision has slight brownish drainage on dressing,, no erythema, has abdominal binder HEAD: Normal in size and contour. No evidence of  trauma EYES: Lids open and close normally. No blepharitis, entropion or ectropion. PERRL. Conjunctivae are clear and sclerae are white. Lenses are without opacity EARS: Pinnae are normal. Patient hears normal voice tunes of the examiner MOUTH and THROAT: Lips are without lesions. Oral mucosa is moist and without lesions. Tongue is normal in shape, size, and color and without lesions NECK: supple, trachea midline, no neck masses, no thyroid tenderness, no thyromegaly LYMPHATICS: no LAN in the neck, no supraclavicular LAN RESPIRATORY: breathing is even & unlabored, BS CTAB CARDIAC: RRR, no murmur,no extra heart  sounds, BLE edema trace GI: abdomen soft, normal BS, no masses, no tenderness, no hepatomegaly, no splenomegaly EXTREMITIES:  Able to move 4 extremities PSYCHIATRIC: Alert and oriented X 3. Affect and behavior are appropriate  LABS/RADIOLOGY: Labs reviewed: Basic Metabolic Panel:  Recent Labs  16/10/96 11/26/15 11/30/15  NA 136* 135* 137  K 3.6 3.5 4.1  BUN 9 7 6   CREATININE 0.6 0.5 0.6   Liver Function Tests:  Recent Labs  11/05/15 0700 11/25/15  AST 26 13  ALT 14 6*  ALKPHOS 82 64   CBC:  Recent Labs  11/19/15 11/22/15 11/23/15 11/30/15  WBC 13.0 13.8 10.4 8.1  NEUTROABS 10  --   --  5  HGB 10.5* 11.1* 11.4* 10.9*  HCT 31* 34* 34* 34*  PLT 518* 581* 510* 249      ASSESSMENT/PLAN:  Physical deconditioning -  For Home health OT, CNA, Skilled Nurse and PT; fall precautions  SBO S/P exploratory laparotomy with lysis of adhesions and reduction of internal hernia - given IV antibiotics in the hospital; follow-up with Dr. Bettey Mare, General Surgery; continue acetaminophen 325 mg take 2 tabs = 650 mg by mouth every 4 hours when necessary for pain; Aspirin 325 mg 1 tab by mouth daily for DVT prophylaxis   Hyperlipidemia - the Zocor 20 mg 1 tab by mouth daily Lab Results  Component Value Date   CHOL 119 11/17/2015   HDL 28* 11/17/2015   LDLCALC 73 11/17/2015    TRIG 90 11/17/2015    Hypertension - continue Norvasc 5 mg 1 tab by mouth daily and metoprolol succinate ER 50 mg 1 tab by mouth daily  Allergic rhinitis - continue Zyrtec 10 mg 1 tab by mouth daily  Anemia, acute blood loss - stable Lab Results  Component Value Date   WBC 8.1 11/30/2015   HGB 10.9* 11/30/2015   HCT 34* 11/30/2015   PLT 249 11/30/2015   Protein calorie malnutrition, severe - albumin 2.5; continue Procel 2 scoops by mouth TID  Surgical wound infection - continue clindamycin 300 mg 1 capsule by mouth 4 times a day 1 more day  Hypomagnesemia - continue magnesium 400 mg 1 tab by mouth twice a day      I have filled out patient's discharge paperwork and written prescriptions.  Patient will receive home health PT, OT, Skilled Nurse and CNA.  DME provided:  None  Total discharge time: Greater than 30 minutes  Discharge time involved coordination of the discharge process with social worker, nursing staff and therapy department. Medical justification for home health services verified.     Kenard Gower, NP [employer] [phone]

## 2019-08-02 ENCOUNTER — Emergency Department (HOSPITAL_BASED_OUTPATIENT_CLINIC_OR_DEPARTMENT_OTHER)
Admission: EM | Admit: 2019-08-02 | Discharge: 2019-08-02 | Disposition: A | Discharge status: Home / Self Care | Payer: Medicare PPO | Attending: Emergency Medicine | Admitting: Emergency Medicine

## 2019-08-02 ENCOUNTER — Encounter (HOSPITAL_BASED_OUTPATIENT_CLINIC_OR_DEPARTMENT_OTHER): Payer: Self-pay | Admitting: Emergency Medicine

## 2019-08-02 ENCOUNTER — Emergency Department (HOSPITAL_BASED_OUTPATIENT_CLINIC_OR_DEPARTMENT_OTHER): Payer: Medicare PPO

## 2019-08-02 ENCOUNTER — Other Ambulatory Visit: Payer: Self-pay

## 2019-08-02 DIAGNOSIS — I110 Hypertensive heart disease with heart failure: Secondary | ICD-10-CM | POA: Diagnosis not present

## 2019-08-02 DIAGNOSIS — J45909 Unspecified asthma, uncomplicated: Secondary | ICD-10-CM | POA: Insufficient documentation

## 2019-08-02 DIAGNOSIS — Z79899 Other long term (current) drug therapy: Secondary | ICD-10-CM | POA: Insufficient documentation

## 2019-08-02 DIAGNOSIS — Z7982 Long term (current) use of aspirin: Secondary | ICD-10-CM | POA: Diagnosis not present

## 2019-08-02 DIAGNOSIS — R1011 Right upper quadrant pain: Secondary | ICD-10-CM | POA: Diagnosis present

## 2019-08-02 DIAGNOSIS — I2510 Atherosclerotic heart disease of native coronary artery without angina pectoris: Secondary | ICD-10-CM | POA: Insufficient documentation

## 2019-08-02 DIAGNOSIS — I509 Heart failure, unspecified: Secondary | ICD-10-CM | POA: Insufficient documentation

## 2019-08-02 DIAGNOSIS — N3000 Acute cystitis without hematuria: Secondary | ICD-10-CM | POA: Diagnosis not present

## 2019-08-02 DIAGNOSIS — Z880 Allergy status to penicillin: Secondary | ICD-10-CM | POA: Diagnosis not present

## 2019-08-02 DIAGNOSIS — Z888 Allergy status to other drugs, medicaments and biological substances status: Secondary | ICD-10-CM | POA: Diagnosis not present

## 2019-08-02 LAB — CBC WITH DIFFERENTIAL/PLATELET
Abs Immature Granulocytes: 0.02 10*3/uL (ref 0.00–0.07)
Basophils Absolute: 0 10*3/uL (ref 0.0–0.1)
Basophils Relative: 0 %
Eosinophils Absolute: 0 10*3/uL (ref 0.0–0.5)
Eosinophils Relative: 0 %
HCT: 42.3 % (ref 36.0–46.0)
Hemoglobin: 14 g/dL (ref 12.0–15.0)
Immature Granulocytes: 0 %
Lymphocytes Relative: 22 %
Lymphs Abs: 2.2 10*3/uL (ref 0.7–4.0)
MCH: 31 pg (ref 26.0–34.0)
MCHC: 33.1 g/dL (ref 30.0–36.0)
MCV: 93.6 fL (ref 80.0–100.0)
Monocytes Absolute: 0.8 10*3/uL (ref 0.1–1.0)
Monocytes Relative: 8 %
Neutro Abs: 6.9 10*3/uL (ref 1.7–7.7)
Neutrophils Relative %: 70 %
Platelets: 219 10*3/uL (ref 150–400)
RBC: 4.52 MIL/uL (ref 3.87–5.11)
RDW: 14 % (ref 11.5–15.5)
WBC: 10 10*3/uL (ref 4.0–10.5)
nRBC: 0 % (ref 0.0–0.2)

## 2019-08-02 LAB — URINALYSIS, ROUTINE W REFLEX MICROSCOPIC
Bilirubin Urine: NEGATIVE
Glucose, UA: NEGATIVE mg/dL
Hgb urine dipstick: NEGATIVE
Ketones, ur: 15 mg/dL — AB
Nitrite: POSITIVE — AB
Protein, ur: NEGATIVE mg/dL
Specific Gravity, Urine: 1.015 (ref 1.005–1.030)
pH: 7 (ref 5.0–8.0)

## 2019-08-02 LAB — COMPREHENSIVE METABOLIC PANEL
ALT: 14 U/L (ref 0–44)
AST: 28 U/L (ref 15–41)
Albumin: 3.9 g/dL (ref 3.5–5.0)
Alkaline Phosphatase: 62 U/L (ref 38–126)
Anion gap: 10 (ref 5–15)
BUN: 14 mg/dL (ref 8–23)
CO2: 27 mmol/L (ref 22–32)
Calcium: 9.6 mg/dL (ref 8.9–10.3)
Chloride: 96 mmol/L — ABNORMAL LOW (ref 98–111)
Creatinine, Ser: 0.59 mg/dL (ref 0.44–1.00)
GFR calc Af Amer: >60 mL/min (ref >60)
GFR calc non Af Amer: >60 mL/min (ref >60)
Glucose, Bld: 108 mg/dL — ABNORMAL HIGH (ref 70–99)
Potassium: 4 mmol/L (ref 3.5–5.1)
Sodium: 133 mmol/L — ABNORMAL LOW (ref 135–145)
Total Bilirubin: 0.5 mg/dL (ref 0.3–1.2)
Total Protein: 7.4 g/dL (ref 6.5–8.1)

## 2019-08-02 LAB — URINALYSIS, MICROSCOPIC (REFLEX): WBC, UA: >50 WBC/hpf (ref 0–5)

## 2019-08-02 LAB — LIPASE, BLOOD: Lipase: 69 U/L — ABNORMAL HIGH (ref 11–51)

## 2019-08-02 MED ORDER — CIPROFLOXACIN HCL 500 MG PO TABS
500.0000 mg | ORAL_TABLET | Freq: Once | ORAL | Status: AC
  Administered 2019-08-02: 500 mg via ORAL
  Filled 2019-08-02: qty 1

## 2019-08-02 MED ORDER — CIPROFLOXACIN HCL 500 MG PO TABS: 500.0000 mg | ORAL_TABLET | Freq: Two times a day (BID) | ORAL | 0 refills | Status: AC

## 2019-08-02 NOTE — ED Notes (Signed)
Patient transported to Ultrasound 

## 2019-08-02 NOTE — ED Provider Notes (Signed)
[facility] EMERGENCY DEPARTMENT Provider Note   CSN: [id number] Arrival date & time: 08/02/19  1017     History Chief Complaint  Patient presents with  . Abdominal Pain    Wendy Dickerson is a 86 y.o. female.  Pt presents to the ED today with RLQ abd pain.  Pt said it started yesterday, but became worse overnight.  Pt does have a hx of SBO, but this does not feel the same.  She has been having a daily BM.  She had some nausea last night, but no vomiting.  She has not had her Covid vaccine yet due to transportation issues.  She has not had a fever, cough, or sob.  No known covid exposures.        Past Medical History:  Diagnosis Date  . Acute blood loss anemia   . Allergic rhinitis   . Asthma   . Benign essential HTN   . CAD (coronary artery disease)   . CHF (congestive heart failure) (HCC)   . Chronic cystitis   . Diverticulosis   . GERD (gastroesophageal reflux disease)   . Hard of hearing   . Hearing loss of both ears    Sensorineural   . History of rheumatic fever   . HLD (hyperlipidemia)   . Hypokalemia   . Hypomagnesemia   . Hyponatremia   . Lactic acidosis   . Leukocytosis   . Osteoarthritis   . Osteopenia   . Palpitations    PACs  . Periodic headache syndrome   . Physical deconditioning   . Pneumonia    Bilateral lower lobes  . SBO (small bowel obstruction) (HCC)   . Vasovagal syncope     Patient Active Problem List   Diagnosis Date Noted  . SBO (small bowel obstruction) (HCC) S/P expolre lap with lysis od adhesions 11/16/2015  . Hyperlipidemia 11/16/2015  . Essential hypertension 11/16/2015  . Allergic rhinitis 11/16/2015  . Acute blood loss anemia 11/16/2015  . Leukocytosis 11/16/2015    Past Surgical History:  Procedure Laterality Date  . EXPLORATORY LAPAROTOMY  [id number]   Lysis of adhesions     OB History   No obstetric history on file.     No family history on file.  Social History   Tobacco Use  . Smoking status:  Never Smoker  . Smokeless tobacco: Never Used  Substance Use Topics  . Alcohol use: No    Alcohol/week: 0.0 standard drinks  . Drug use: No    Home Medications Prior to Admission medications   Medication Sig Start Date End Date Taking? Authorizing Provider  acetaminophen (TYLENOL) 325 MG tablet Take 650 mg by mouth every 4 (four) hours as needed.    [provider]  albuterol (PROVENTIL HFA;VENTOLIN HFA) 108 (90 Base) MCG/ACT inhaler Inhale 1 puff into the lungs every 6 (six) hours as needed for wheezing or shortness of breath.    [provider]  amLODipine (NORVASC) 5 MG tablet Take 5 mg by mouth daily.    [provider]  aspirin 325 MG tablet Take 325 mg by mouth daily.    [provider]  cetirizine (ZYRTEC) 10 MG tablet Take 10 mg by mouth daily.    [provider]  ciprofloxacin (CIPRO) 500 MG tablet Take 1 tablet (500 mg total) by mouth 2 (two) times daily. 08/02/19   Jacalyn Lefevre, MD  fluticasone (FLOVENT HFA) 220 MCG/ACT inhaler Inhale 1 puff into the lungs 2 (two) times daily.  [provider]  glucosamine-chondroitin 500-400 MG tablet Take 1 tablet by mouth 2 (two) times daily.    [provider]  magnesium oxide (MAG-OX) 400 MG tablet Take 400 mg by mouth 2 (two) times daily.     [provider]  metoprolol succinate (TOPROL-XL) 50 MG 24 hr tablet Take 50 mg by mouth daily. Take with or immediately following a meal.    [provider]  Protein (PROCEL) POWD Take 2 scoop by mouth 3 (three) times daily.     [provider]  saccharomyces boulardii (FLORASTOR) 250 MG capsule Take 250 mg by mouth 2 (two) times daily. Take for 13 days    [provider]  simvastatin (ZOCOR) 20 MG tablet Take 20 mg by mouth daily.    [provider]    Allergies    Ace inhibitors, Atrovent [ipratropium], Augmentin [amoxicillin-pot clavulanate], Beconase [beclomethasone], Cartia xt  [diltiazem hcl], Celebrex [celecoxib], Clarithromycin, Corgard [nadolol], Doxycycline, Intal [cromolyn], Iodine, Metoprolol, Oxycontin [oxycodone hcl], Procardia [nifedipine], and Theophyllines  Review of Systems   Review of Systems  Gastrointestinal: Positive for abdominal pain.  All other systems reviewed and are negative.   Physical Exam Updated Vital Signs BP (!) 185/78 (BP Location: Right Arm)   Pulse 72 Comment: Simultaneous filing. User may not have seen previous data.  Temp 97.8 F (36.6 C) (Oral)   Resp 14   Ht 4\' 11"  (1.499 m)   Wt 61.2 kg   SpO2 99% Comment: Simultaneous filing. User may not have seen previous data.  BMI 27.27 kg/m   Physical Exam Vitals and nursing note reviewed.  Constitutional:      Appearance: She is well-developed.  HENT:     Head: Normocephalic and atraumatic.     Mouth/Throat:     Mouth: Mucous membranes are moist.     Pharynx: Oropharynx is clear.  Eyes:     Extraocular Movements: Extraocular movements intact.     Pupils: Pupils are equal, round, and reactive to light.  Cardiovascular:     Rate and Rhythm: Normal rate and regular rhythm.  Pulmonary:     Effort: Pulmonary effort is normal.     Breath sounds: Normal breath sounds.  Abdominal:     General: Abdomen is flat. Bowel sounds are normal.     Palpations: Abdomen is soft.     Tenderness: There is abdominal tenderness in the right upper quadrant and right lower quadrant.  Skin:    General: Skin is warm.     Capillary Refill: Capillary refill takes less than 2 seconds.  Neurological:     General: No focal deficit present.     Mental Status: She is alert and oriented to person, place, and time.  Psychiatric:        Mood and Affect: Mood normal.        Behavior: Behavior normal.     ED Results / Procedures / Treatments   Labs (all labs ordered are listed, but only abnormal results are displayed) Labs Reviewed  COMPREHENSIVE METABOLIC PANEL - Abnormal; Notable for the  following components:      Result Value   Sodium 133 (*)    Chloride 96 (*)    Glucose, Bld 108 (*)    All other components within normal limits  LIPASE, BLOOD - Abnormal; Notable for the following components:   Lipase 69 (*)    All other components within normal limits  URINALYSIS, ROUTINE W REFLEX MICROSCOPIC - Abnormal; Notable for the following components:  APPearance CLOUDY (*)    Ketones, ur 15 (*)    Nitrite POSITIVE (*)    Leukocytes,Ua SMALL (*)    All other components within normal limits  URINALYSIS, MICROSCOPIC (REFLEX) - Abnormal; Notable for the following components:   Bacteria, UA MANY (*)    All other components within normal limits  CBC WITH DIFFERENTIAL/PLATELET    EKG None  Radiology CT ABDOMEN PELVIS WO CONTRAST  Result Date: 08/02/2019 CLINICAL DATA:  Right lower quadrant abdominal pain, nausea EXAM: CT ABDOMEN AND PELVIS WITHOUT CONTRAST TECHNIQUE: Multidetector CT imaging of the abdomen and pelvis was performed following the standard protocol without IV contrast. COMPARISON:  11/05/2015 FINDINGS: Lower chest: Lung bases are essentially clear, noting platelike scarring/atelectasis in the right middle lobe. Hepatobiliary: Unenhanced liver is unremarkable. Layering gallstones (series 2/image 32), without associated inflammatory changes. No intrahepatic or extrahepatic ductal dilatation. Pancreas: Within normal limits. Spleen: Within normal limits. Adrenals/Urinary Tract: Adrenal glands are within normal limits. Bilateral renal cysts, measuring up to 2.6 cm in the left upper pole and 1.9 cm in the right lower pole. No renal calculi or hydronephrosis. Bladder is mildly thick-walled although underdistended. Stomach/Bowel: Mild wall thickening involving the gastric fundus (series 2/image 10), nonfocal and possibly reflecting underdistention, although gastritis is possible. No evidence of bowel obstruction. Appendix is not discretely visualized. Mildly abnormal position of  the ileocecal valve in the midline lower pelvis (series 2/image 48). Sigmoid diverticulosis, without evidence of diverticulitis. Vascular/Lymphatic: No evidence of abdominal aortic aneurysm. Atherosclerotic calcifications of the abdominal aorta and branch vessels. No suspicious abdominopelvic lymphadenopathy. Reproductive: Status post hysterectomy. No adnexal masses. Other: No abdominopelvic ascites. Musculoskeletal: Status post PLIF at L3-S1. No evidence of complication. Degenerative changes of the visualized thoracolumbar spine. IMPRESSION: Appendix is not discretely visualized and may be surgically absent. Regardless, there are no findings suspicious for appendicitis. No evidence of bowel obstruction. Left colonic diverticulosis, without evidence of diverticulitis. Equivocal wall thickening involving the gastric fundus, likely reflecting underdistention, although gastritis is possible. Cholelithiasis, without associated inflammatory changes. Additional ancillary findings as above. Electronically Signed   By: Julian Hy M.D.   On: 08/02/2019 11:42   US Abdomen Limited RUQ  Result Date: 08/02/2019 CLINICAL DATA:  Right upper quadrant abdominal pain and nausea since yesterday. Cholelithiasis on CT. EXAM: ULTRASOUND ABDOMEN LIMITED RIGHT UPPER QUADRANT COMPARISON:  08/02/2019 CT abdomen/pelvis. FINDINGS: Gallbladder: Layering tiny shadowing gallstones in the nondistended gallbladder. No pericholecystic fluid. No gallbladder wall thickening. No sonographic Murphy's sign. Common bile duct: Diameter: 2 mm Liver: No focal lesion identified. Within normal limits in parenchymal echogenicity. Portal vein is patent on color Doppler imaging with normal direction of blood flow towards the liver. Other: None. IMPRESSION: 1. Cholelithiasis.  No sonographic evidence of acute cholecystitis. 2. No biliary ductal dilatation. 3. Normal liver. Electronically Signed   By: Ilona Sorrel M.D.   On: 08/02/2019 12:56     Procedures Procedures (including critical care time)  Medications Ordered in ED Medications  ciprofloxacin (CIPRO) tablet 500 mg (has no administration in time range)    ED Course  I have reviewed the triage vital signs and the nursing notes.  Pertinent labs & imaging results that were available during my care of the patient were reviewed by me and considered in my medical decision making (see chart for details).    MDM Rules/Calculators/A&P                      Pt is feeling much better.  She has cholelithiasis, but not cholecystitis. F/u with surgery.  She will be started on cipro for her uti (allergy to augmentin).  She is encouraged to avoid fatty/greasy foods.  Pt is stable for d/c.  Return if worse.   Final Clinical Impression(s) / ED Diagnoses Final diagnoses:  RUQ pain  Acute cystitis without hematuria    Rx / DC Orders ED Discharge Orders         Ordered    ciprofloxacin (CIPRO) 500 MG tablet  2 times daily     08/02/19 1413           Jacalyn Lefevre, MD 08/02/19 1416

## 2019-08-02 NOTE — ED Triage Notes (Signed)
RLQ pain since yesterday. Denies N/V/D.  

## 2020-08-11 IMAGING — CT CT ABD-PELV W/O CM
2 of 4 series · 16 of 46 positions shown, 18 images · non-contrast
Comparison: 11/05/2015

CLINICAL DATA: Right lower quadrant abdominal pain, nausea

EXAM:
CT ABDOMEN AND PELVIS WITHOUT CONTRAST
TECHNIQUE: Multidetector CT imaging of the abdomen and pelvis was performed
following the standard protocol without IV contrast.

[Series 2: axial st · axial · 0.77mm/px · z∈[-406,-60]mm · 13 of 75 slices shown, 15 images]
[im 3/75  soft-tissue]
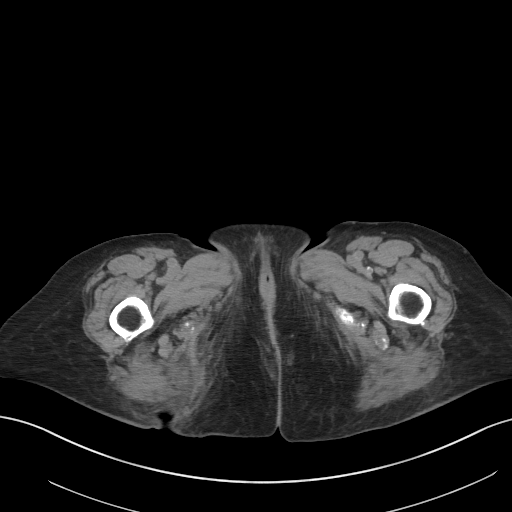
[im 3/75  bone]
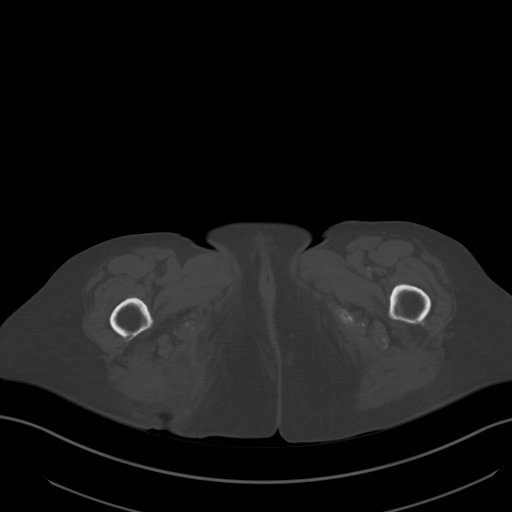
[im 9/75  soft-tissue]
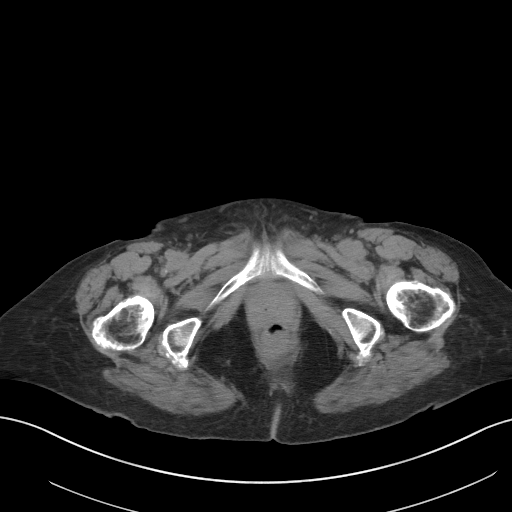
[im 15/75  soft-tissue]
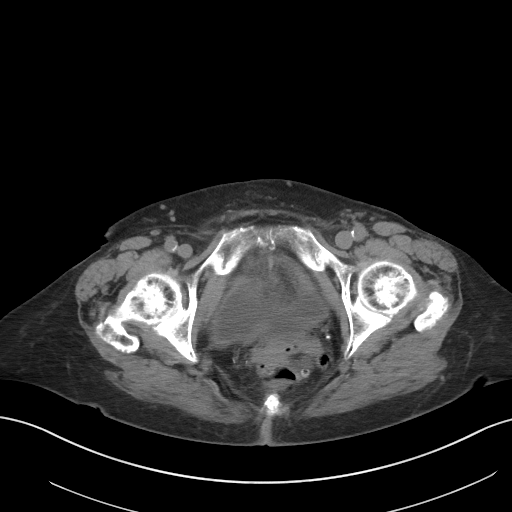
[im 21/75  soft-tissue]
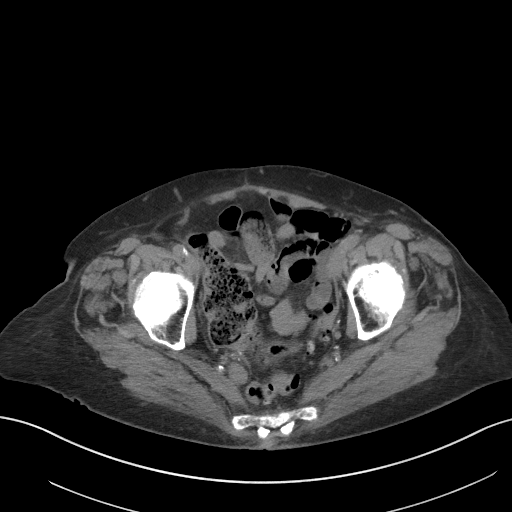
[im 27/75  soft-tissue]
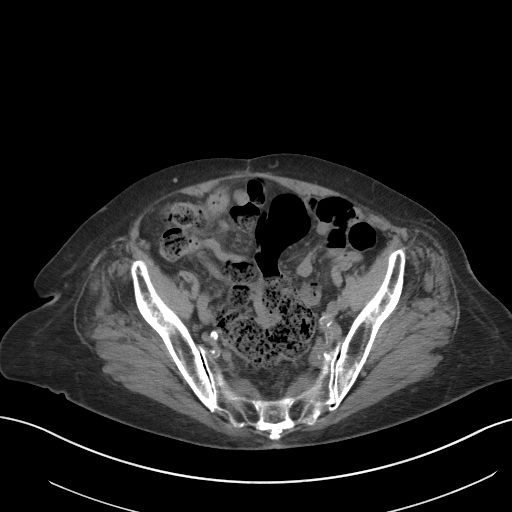
[im 33/75  soft-tissue]
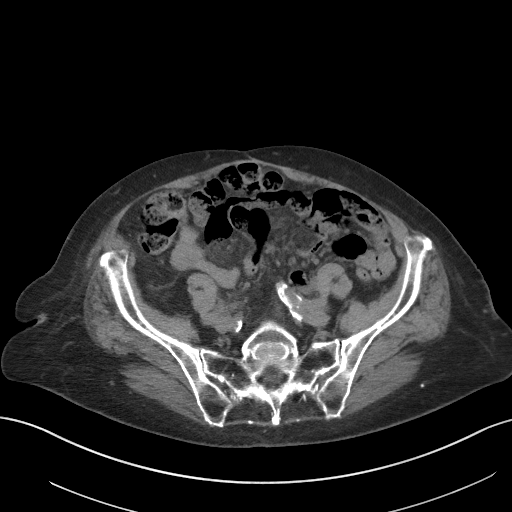
[im 39/75  soft-tissue]
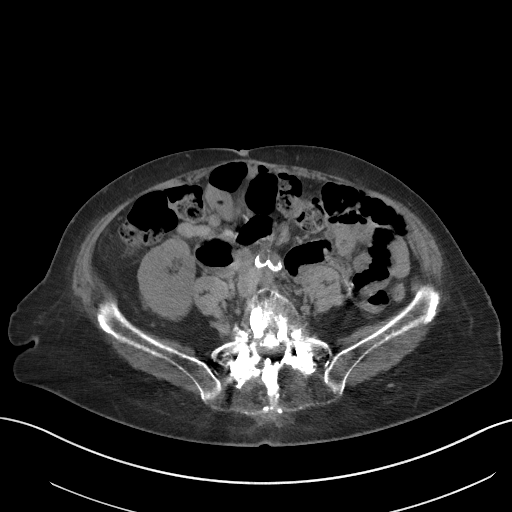
[im 42/75  soft-tissue]
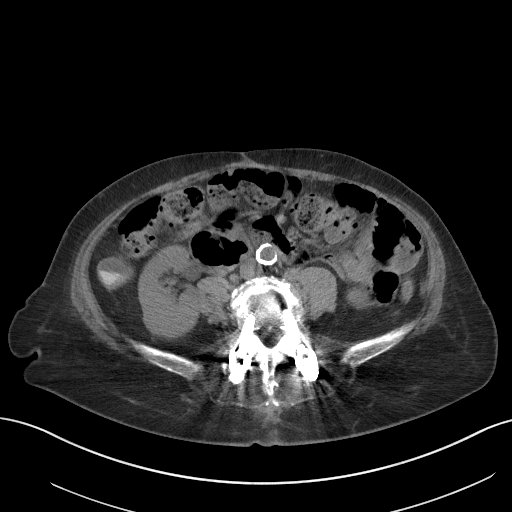
[im 48/75  soft-tissue]
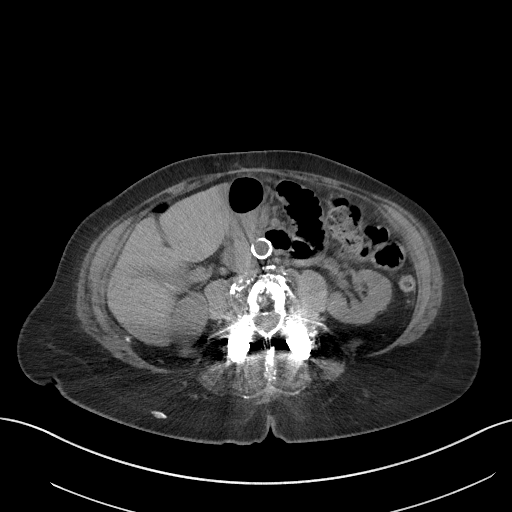
[im 48/75  bone]
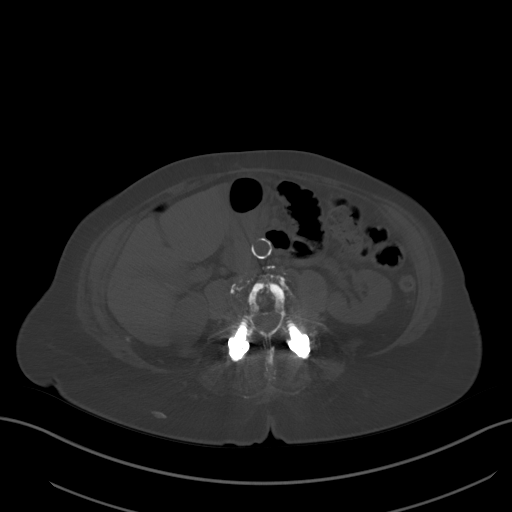
[im 54/75  soft-tissue]
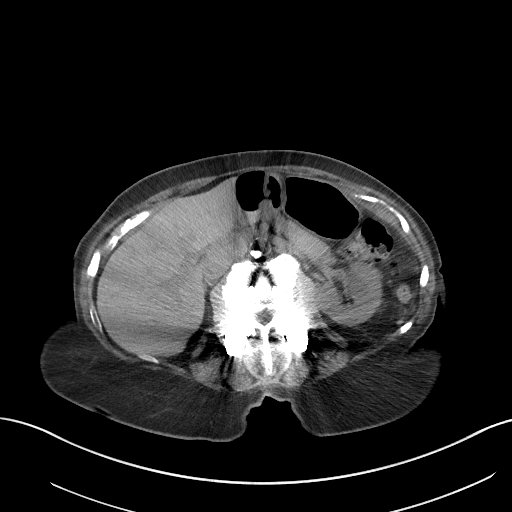
[im 60/75  soft-tissue]
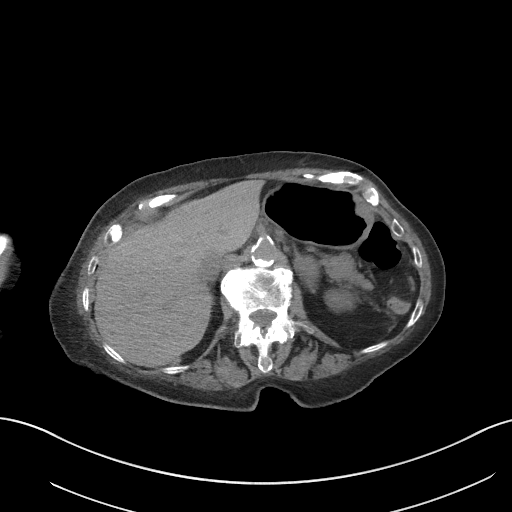
[im 66/75  soft-tissue]
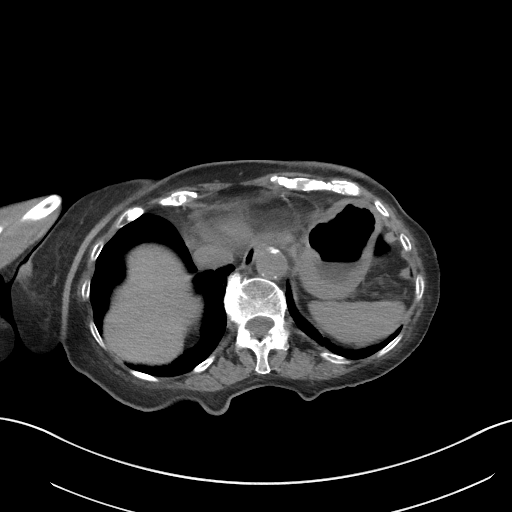
[im 72/75  soft-tissue]
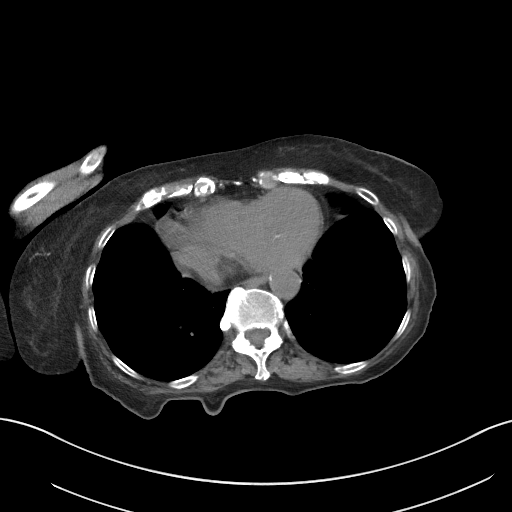

[Series 5: coronal st · coronal · 0.66mm/px · 3 of 101 slices shown]
[im 34/101  soft-tissue]
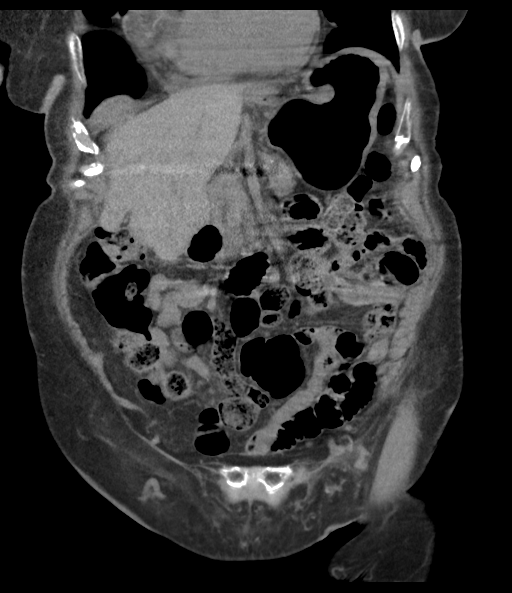
[im 45/101  soft-tissue]
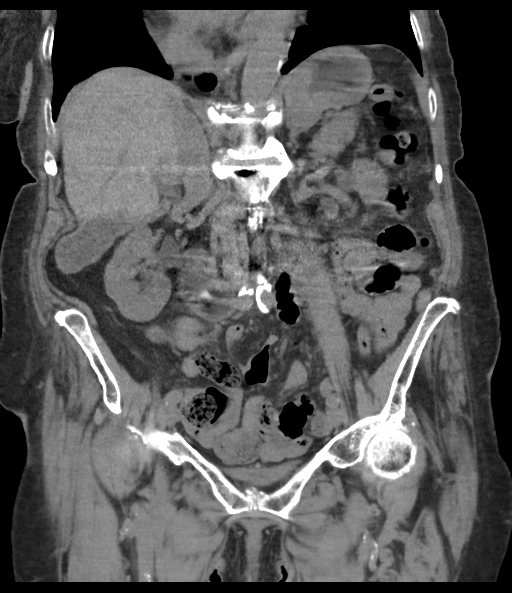
[im 56/101  soft-tissue]
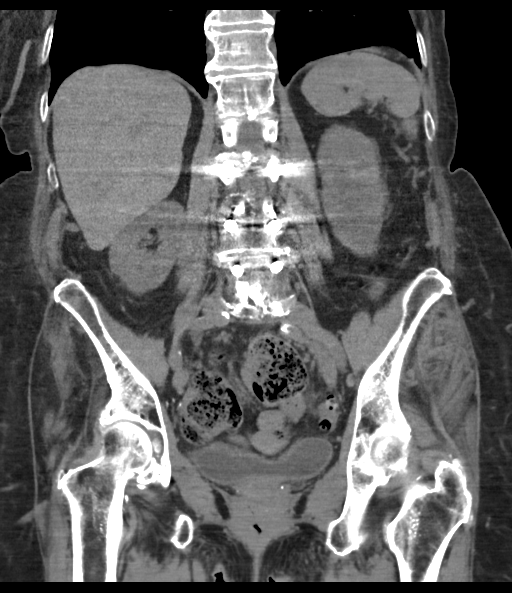

[16 of 46 positions shown; findings below may reference images not displayed]

FINDINGS: Lower chest: Lung bases are essentially clear, noting platelike
scarring/atelectasis in the right middle lobe.

Hepatobiliary: Unenhanced liver is unremarkable.

Layering gallstones (series 2/image 32), without associated
inflammatory changes. No intrahepatic or extrahepatic ductal
dilatation.

Pancreas: Within normal limits.

Spleen: Within normal limits.

Adrenals/Urinary Tract: Adrenal glands are within normal limits.

Bilateral renal cysts, measuring up to 2.6 cm in the left upper pole
and 1.9 cm in the right lower pole. No renal calculi or
hydronephrosis.

Bladder is mildly thick-walled although underdistended.

Stomach/Bowel: Mild wall thickening involving the gastric fundus
(series 2/image 10), nonfocal and possibly reflecting
underdistention, although gastritis is possible.

No evidence of bowel obstruction.

Appendix is not discretely visualized.

Mildly abnormal position of the ileocecal valve in the midline lower
pelvis (series 2/image 48).

Sigmoid diverticulosis, without evidence of diverticulitis.

Vascular/Lymphatic: No evidence of abdominal aortic aneurysm.

Atherosclerotic calcifications of the abdominal aorta and branch
vessels.

No suspicious abdominopelvic lymphadenopathy.

Reproductive: Status post hysterectomy.

No adnexal masses.

Other: No abdominopelvic ascites.

Musculoskeletal: Status post PLIF at L3-S1. No evidence of
complication. Degenerative changes of the visualized thoracolumbar
spine.
IMPRESSION: Appendix is not discretely visualized and may be surgically absent.
Regardless, there are no findings suspicious for appendicitis.

No evidence of bowel obstruction. Left colonic diverticulosis,
without evidence of diverticulitis.

Equivocal wall thickening involving the gastric fundus, likely
reflecting underdistention, although gastritis is possible.

Cholelithiasis, without associated inflammatory changes.

Additional ancillary findings as above.

## 2022-08-29 ENCOUNTER — Other Ambulatory Visit: Payer: Self-pay

## 2022-08-29 ENCOUNTER — Emergency Department (HOSPITAL_COMMUNITY)
Admission: EM | Admit: 2022-08-29 | Discharge: 2022-08-29 | Disposition: A | Discharge status: Home / Self Care | Payer: Medicare PPO | Attending: Emergency Medicine | Admitting: Emergency Medicine

## 2022-08-29 ENCOUNTER — Emergency Department (HOSPITAL_COMMUNITY): Payer: Medicare PPO

## 2022-08-29 DIAGNOSIS — Z79899 Other long term (current) drug therapy: Secondary | ICD-10-CM | POA: Insufficient documentation

## 2022-08-29 DIAGNOSIS — I110 Hypertensive heart disease with heart failure: Secondary | ICD-10-CM | POA: Insufficient documentation

## 2022-08-29 DIAGNOSIS — I4891 Unspecified atrial fibrillation: Secondary | ICD-10-CM | POA: Diagnosis not present

## 2022-08-29 DIAGNOSIS — Z7982 Long term (current) use of aspirin: Secondary | ICD-10-CM | POA: Diagnosis not present

## 2022-08-29 DIAGNOSIS — R600 Localized edema: Secondary | ICD-10-CM | POA: Diagnosis not present

## 2022-08-29 DIAGNOSIS — I509 Heart failure, unspecified: Secondary | ICD-10-CM | POA: Diagnosis not present

## 2022-08-29 DIAGNOSIS — J45909 Unspecified asthma, uncomplicated: Secondary | ICD-10-CM | POA: Insufficient documentation

## 2022-08-29 DIAGNOSIS — I2510 Atherosclerotic heart disease of native coronary artery without angina pectoris: Secondary | ICD-10-CM | POA: Insufficient documentation

## 2022-08-29 DIAGNOSIS — R079 Chest pain, unspecified: Secondary | ICD-10-CM

## 2022-08-29 LAB — COMPREHENSIVE METABOLIC PANEL
ALT: 12 U/L (ref 0–44)
AST: 22 U/L (ref 15–41)
Albumin: 3.3 g/dL — ABNORMAL LOW (ref 3.5–5.0)
Alkaline Phosphatase: 62 U/L (ref 38–126)
Anion gap: 12 (ref 5–15)
BUN: 29 mg/dL — ABNORMAL HIGH (ref 8–23)
CO2: 26 mmol/L (ref 22–32)
Calcium: 9.5 mg/dL (ref 8.9–10.3)
Chloride: 98 mmol/L (ref 98–111)
Creatinine, Ser: 1.02 mg/dL — ABNORMAL HIGH (ref 0.44–1.00)
GFR, Estimated: 51 mL/min — ABNORMAL LOW (ref >60)
Glucose, Bld: 133 mg/dL — ABNORMAL HIGH (ref 70–99)
Potassium: 3 mmol/L — ABNORMAL LOW (ref 3.5–5.1)
Sodium: 136 mmol/L (ref 135–145)
Total Bilirubin: 0.8 mg/dL (ref 0.3–1.2)
Total Protein: 7.2 g/dL (ref 6.5–8.1)

## 2022-08-29 LAB — CBC WITH DIFFERENTIAL/PLATELET
Abs Immature Granulocytes: 0.05 10*3/uL (ref 0.00–0.07)
Basophils Absolute: 0 10*3/uL (ref 0.0–0.1)
Basophils Relative: 0 %
Eosinophils Absolute: 0 10*3/uL (ref 0.0–0.5)
Eosinophils Relative: 0 %
HCT: 38.8 % (ref 36.0–46.0)
Hemoglobin: 13 g/dL (ref 12.0–15.0)
Immature Granulocytes: 0 %
Lymphocytes Relative: 9 %
Lymphs Abs: 1.1 10*3/uL (ref 0.7–4.0)
MCH: 30.2 pg (ref 26.0–34.0)
MCHC: 33.5 g/dL (ref 30.0–36.0)
MCV: 90.2 fL (ref 80.0–100.0)
Monocytes Absolute: 1.2 10*3/uL — ABNORMAL HIGH (ref 0.1–1.0)
Monocytes Relative: 9 %
Neutro Abs: 10.2 10*3/uL — ABNORMAL HIGH (ref 1.7–7.7)
Neutrophils Relative %: 82 %
Platelets: 193 10*3/uL (ref 150–400)
RBC: 4.3 MIL/uL (ref 3.87–5.11)
RDW: 14.6 % (ref 11.5–15.5)
WBC: 12.5 10*3/uL — ABNORMAL HIGH (ref 4.0–10.5)
nRBC: 0 % (ref 0.0–0.2)

## 2022-08-29 LAB — TROPONIN I (HIGH SENSITIVITY)
Troponin I (High Sensitivity): 17 ng/L (ref <18)
Troponin I (High Sensitivity): 23 ng/L — ABNORMAL HIGH (ref <18)

## 2022-08-29 LAB — TSH: TSH: 0.579 u[IU]/mL (ref 0.350–4.500)

## 2022-08-29 LAB — MAGNESIUM: Magnesium: 2 mg/dL (ref 1.7–2.4)

## 2022-08-29 MED ORDER — IOHEXOL 350 MG/ML SOLN
60.0000 mL | Freq: Once | INTRAVENOUS | Status: AC | PRN
  Administered 2022-08-29: 60 mL via INTRAVENOUS

## 2022-08-29 NOTE — Discharge Instructions (Addendum)
Follow-up with the A-fib clinic.  Return for your heart racing or worsening chest pain.

## 2022-08-29 NOTE — ED Triage Notes (Signed)
[facility] reports pt coming from home c/o chest pain that started at noon. Center chest, non radiating.

## 2022-08-29 NOTE — ED Provider Notes (Signed)
[place] [facility]   CSN: [id number] Arrival date & time: 08/29/22  1450     History  Chief Complaint  Patient presents with   Chest Pain    Wendy ShellerLois Bastarache is a 89 y.o. female.   Chest Pain Patient does not chest pain.  Anterior chest.  Started on noon.  Found to be in A-fib with RVR.  Does not feel her heart eating any differently.  No known history of A-fib.  Not on blood thinners.  I did however have recent pelvis fracture.  No swelling in her legs.  It was pain in her anterior mid chest.  No nausea or vomiting.    Past Medical History:  Diagnosis Date   Acute blood loss anemia    Allergic rhinitis    Asthma    Benign essential HTN    CAD (coronary artery disease)    CHF (congestive heart failure) (HCC)    Chronic cystitis    Diverticulosis    GERD (gastroesophageal reflux disease)    Hard of hearing    Hearing loss of both ears    Sensorineural    History of rheumatic fever    HLD (hyperlipidemia)    Hypokalemia    Hypomagnesemia    Hyponatremia    Lactic acidosis    Leukocytosis    Osteoarthritis    Osteopenia    Palpitations    PACs   Periodic headache syndrome    Physical deconditioning    Pneumonia    Bilateral lower lobes   SBO (small bowel obstruction) (HCC)    Vasovagal syncope     Home Medications Prior to Admission medications   Medication Sig Start Date End Date Taking? Authorizing Provider  acetaminophen (TYLENOL) 325 MG tablet Take 650 mg by mouth every 4 (four) hours as needed.    [provider]  albuterol (PROVENTIL HFA;VENTOLIN HFA) 108 (90 Base) MCG/ACT inhaler Inhale 1 puff into the lungs every 6 (six) hours as needed for wheezing or shortness of breath.    [provider]  amLODipine (NORVASC) 5 MG tablet Take 5 mg by mouth daily.    [provider]  aspirin 325 MG tablet Take 325 mg by mouth daily.    [provider]  cetirizine (ZYRTEC) 10 MG  tablet Take 10 mg by mouth daily.    [provider]  ciprofloxacin (CIPRO) 500 MG tablet Take 1 tablet (500 mg total) by mouth 2 (two) times daily. 08/02/19   Jacalyn LefevreHaviland, Julie, MD  fluticasone (FLOVENT HFA) 220 MCG/ACT inhaler Inhale 1 puff into the lungs 2 (two) times daily.    [provider]  glucosamine-chondroitin 500-400 MG tablet Take 1 tablet by mouth 2 (two) times daily.    [provider]  magnesium oxide (MAG-OX) 400 MG tablet Take 400 mg by mouth 2 (two) times daily.     [provider]  metoprolol succinate (TOPROL-XL) 50 MG 24 hr tablet Take 50 mg by mouth daily. Take with or immediately following a meal.    [provider]  Protein (PROCEL) POWD Take 2 scoop by mouth 3 (three) times daily.     [provider]  saccharomyces boulardii (FLORASTOR) 250 MG capsule Take 250 mg by mouth 2 (two) times daily. Take for 13 days    [provider]  simvastatin (ZOCOR) 20 MG tablet Take 20 mg by mouth daily.    [provider]      Allergies  Ace inhibitors, Atrovent [ipratropium], Augmentin [amoxicillin-pot clavulanate], Beconase [beclomethasone], Cartia xt [diltiazem hcl], Celebrex [celecoxib], Clarithromycin, Corgard [nadolol], Doxycycline, Intal [cromolyn], Iodine, Metoprolol, Oxycontin [oxycodone hcl], Procardia [nifedipine], and Theophyllines    Review of Systems   Review of Systems  Cardiovascular:  Positive for chest pain.    Physical Exam Updated Vital Signs BP 101/76   Pulse 94   Temp 97.9 F (36.6 C)   Resp 20   SpO2 98%  Physical Exam Vitals and nursing note reviewed.  Cardiovascular:     Rate and Rhythm: Tachycardia present. Rhythm irregular.  Pulmonary:     Breath sounds: No wheezing, rhonchi or rales.  Musculoskeletal:     Cervical back: Neck supple.     Right lower leg: Edema present.     Left lower leg: Edema present.     Comments: Some mild edema bilateral lower extremities.  Skin:     Capillary Refill: Capillary refill takes less than 2 seconds.  Neurological:     Mental Status: She is alert and oriented to person, place, and time.     ED Results / Procedures / Treatments   Labs (all labs ordered are listed, but only abnormal results are displayed) Labs Reviewed  CBC WITH DIFFERENTIAL/PLATELET - Abnormal; Notable for the following components:      Result Value   WBC 12.5 (*)    Neutro Abs 10.2 (*)    Monocytes Absolute 1.2 (*)    All other components within normal limits  COMPREHENSIVE METABOLIC PANEL - Abnormal; Notable for the following components:   Potassium 3.0 (*)    Glucose, Bld 133 (*)    BUN 29 (*)    Creatinine, Ser 1.02 (*)    Albumin 3.3 (*)    GFR, Estimated 51 (*)    All other components within normal limits  TSH  MAGNESIUM  TROPONIN I (HIGH SENSITIVITY)  TROPONIN I (HIGH SENSITIVITY)    EKG None  Radiology DG Chest Portable 1 View  Result Date: 08/29/2022 CLINICAL DATA:  Chest pain EXAM: PORTABLE CHEST 1 VIEW COMPARISON:  Chest radiograph dated 02/28/2022 FINDINGS: Low lung volumes. Bibasilar patchy opacities. No pleural effusion or pneumothorax. Enlarged cardiomediastinal silhouette is likely projectional. Similar deformity of the right humeral head. IMPRESSION: Low lung volumes with bibasilar patchy opacities, likely atelectasis. Aspiration or pneumonia can be considered in the appropriate clinical setting. Electronically Signed   By: Agustin Cree M.D.   On: 08/29/2022 15:29    Procedures Procedures    Medications Ordered in ED Medications - No data to display  ED Course/ Medical Decision Making/ A&P                             Medical Decision Making Amount and/or Complexity of Data Reviewed Labs: ordered. Radiology: ordered.   Patient with chest pain.  Anterior chest.  EKG shows A-fib with RVR.  No history of A-fib.  Did have chest pain that started today but does not feel the irregularity does so cannot necessarily specify  when the A-fib started.  Not a candidate for ED cardioversion.  Initially rate was fast but now is improved.  Chest x-ray shows potential pneumonia versus CHF.  However with recent immobility will get CT angiography of the chest to further evaluate.  Patient has iodine allergy.  It appears to be only skin related and has cut IV contrast in the past without difficulty.       Final Clinical Impression(s) /  ED Diagnoses Final diagnoses:  None    Rx / DC Orders ED Discharge Orders     None         Benjiman Core, MD 08/29/22 [phone]

## 2022-09-07 ENCOUNTER — Ambulatory Visit (HOSPITAL_COMMUNITY): Payer: Medicare PPO | Admitting: Internal Medicine

## 2022-11-20 DEATH — deceased
